# Patient Record
Sex: Male | Born: 1955 | Race: White | Hispanic: No | Marital: Married | Smoking: Never smoker
Health system: Southern US, Community
[De-identification: ages and names within clinical notes are randomized; demographics above are authoritative.]

## PROBLEM LIST (undated history)

## (undated) DIAGNOSIS — N289 Disorder of kidney and ureter, unspecified: Secondary | ICD-10-CM

## (undated) DIAGNOSIS — E7800 Pure hypercholesterolemia, unspecified: Secondary | ICD-10-CM

## (undated) DIAGNOSIS — I10 Essential (primary) hypertension: Secondary | ICD-10-CM

## (undated) DIAGNOSIS — E119 Type 2 diabetes mellitus without complications: Secondary | ICD-10-CM

## (undated) HISTORY — DX: Pure hypercholesterolemia, unspecified: E78.00

## (undated) HISTORY — DX: Disorder of kidney and ureter, unspecified: N28.9

## (undated) HISTORY — DX: Essential (primary) hypertension: I10

## (undated) HISTORY — DX: Type 2 diabetes mellitus without complications: E11.9

---

## 2012-04-28 ENCOUNTER — Ambulatory Visit: Payer: Self-pay | Admitting: Internal Medicine

## 2012-05-06 ENCOUNTER — Ambulatory Visit: Payer: Self-pay | Admitting: Internal Medicine

## 2012-06-12 ENCOUNTER — Ambulatory Visit: Payer: Self-pay | Admitting: Internal Medicine

## 2012-07-04 ENCOUNTER — Ambulatory Visit: Payer: Self-pay | Admitting: Internal Medicine

## 2017-12-28 ENCOUNTER — Other Ambulatory Visit: Payer: Self-pay | Admitting: Nephrology

## 2017-12-28 DIAGNOSIS — N182 Chronic kidney disease, stage 2 (mild): Secondary | ICD-10-CM

## 2018-01-02 ENCOUNTER — Ambulatory Visit: Payer: Self-pay

## 2018-01-04 ENCOUNTER — Encounter (INDEPENDENT_AMBULATORY_CARE_PROVIDER_SITE_OTHER): Payer: Self-pay

## 2018-01-04 ENCOUNTER — Ambulatory Visit
Admission: RE | Admit: 2018-01-04 | Discharge: 2018-01-04 | Disposition: A | Discharge status: Home / Self Care | Payer: BLUE CROSS/BLUE SHIELD | Source: Ambulatory Visit | Attending: Nephrology | Admitting: Nephrology

## 2018-01-04 DIAGNOSIS — N182 Chronic kidney disease, stage 2 (mild): Secondary | ICD-10-CM | POA: Diagnosis not present

## 2018-05-04 ENCOUNTER — Ambulatory Visit: Payer: BLUE CROSS/BLUE SHIELD | Attending: Orthopedic Surgery | Admitting: Physical Therapy

## 2018-05-04 DIAGNOSIS — M25611 Stiffness of right shoulder, not elsewhere classified: Secondary | ICD-10-CM | POA: Diagnosis present

## 2018-05-04 DIAGNOSIS — M7541 Impingement syndrome of right shoulder: Secondary | ICD-10-CM

## 2018-05-04 DIAGNOSIS — G8929 Other chronic pain: Secondary | ICD-10-CM

## 2018-05-04 DIAGNOSIS — M25511 Pain in right shoulder: Secondary | ICD-10-CM | POA: Diagnosis present

## 2018-05-04 DIAGNOSIS — M6281 Muscle weakness (generalized): Secondary | ICD-10-CM | POA: Insufficient documentation

## 2018-05-04 NOTE — Patient Instructions (Signed)
Access Code: [email or web address]  URL: [email or web address]  Date: 05/04/2018  Prepared by: Dorene Grebe   Exercises  Seated Scapular Retraction - 10 reps - 3 sets - 1x daily - 7x weekly  Circular Shoulder Pendulum with Table Support - 10 reps - 3 sets - 1x daily - 7x weekly  Doorway Pec Stretch at 60 Degrees Abduction with Arm Straight - 3 reps - 2 sets - 20 hold - 1x daily - 7x weekly

## 2018-05-04 NOTE — Therapy (Signed)
[place] [facility] [address] [place], [place], [address] Phone: [phone]   Fax:  [phone]  Physical Therapy Evaluation  Patient Details  Name: Andrew Acevedo MRN: [id number] Date of Birth: 12/03/1955 Referring Provider (PT): Dr. Martha Clan   Encounter Date: 05/04/2018  PT End of Session - 05/04/18 0900    Visit Number  1    Number of Visits  9    Date for PT Re-Evaluation  06/01/18    PT Start Time  0817    PT Stop Time  0926    PT Time Calculation (min)  69 min    Activity Tolerance  Patient tolerated treatment well;Patient limited by pain    Behavior During Therapy  [facility] for tasks assessed/performed       History reviewed. No pertinent past medical history.  History reviewed. No pertinent surgical history.  There were no vitals filed for this visit.   Subjective Assessment - 05/04/18 0823    Subjective  Pt. reports R shld pain for about 3 months which he thinks it happened while lifting at work. Pt. reports pain above 90 degrees of flexion and abduction, as well as horizontal abduction.     Pertinent History  Pt. sleeps on side and sometimes his pain will wake him up (5-6/10 NPS). Pt denies previous hx of shoulder pain. 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 Pt. states he is on full duty at work. Pt. reports trouble sleeping at night and his pain is the same throughout the day. Pt. reports no pain with sitting/ resting but increases with activity. Pt. denies N/T and states that he has tried ice but it has not been helpful. Pt. reports he is type I diabetic, has high blood pressure and high cholesterol..    Limitations  Lifting;House hold activities    Diagnostic tests  see imaging    Patient Stated Goals  relieve pain and be able to get R arm functional at both jobs    Currently in Pain?  No/denies    Pain Score  0-No pain    Pain Location  Shoulder    Pain  Orientation  Right    Pain Descriptors / Indicators  Sharp    Pain Onset  More than a month ago    Pain Frequency  Constant    Aggravating Factors   lifting, any movement past 90 degrees    Pain Relieving Factors  pain medication from MD helps "take the edge off"        AROM (degrees): Flexion: R 139 with pain/ L 158 Extension: R 30/ L WNL Abduction: R 119 with pain/ L 171 IR: R 56/ L 82 ER: R 64/ L 74  MMT: Flexion: R deferred due to pain and limited ROM L 4+/5 Serratus: R deferred due to pain and limited ROM L 4+/5 Abduction: R pain 4+/5 L 5/5 Biceps: R 4/5 with pain  L 5/5 Triceps: R 4/5 with pain L 5/5 ER: R deferred due to pain  L 4+/5 IR: R deferred due to pain L 5/5 Rhomboids: R deferred due to pain L 5/5 Lower trap: R deferred due to pain L 4+/5 Posterior deltoid: R deferred due to pain L 4+/5  Special Tests: Hawkins-Kennedy: R positive L negative Empty Can: R positive L negative Neers: R positive L negative  Palpation: No TTP on R shld  PAM: R Shld: No change in sx  with distraction, inferior mobs Scapula: demonstrated good movement of the shoulder.      PT Education - 05/04/18 [phone]    Education Details  HEP and importance of using ice to calm inflammation down    Person(s) Educated  Patient    Methods  Explanation;Demonstration    Comprehension  Verbalized understanding;Returned demonstration;Verbal cues required         Plan - 05/04/18 0853    Clinical Impression Statement  Pt. Is pleasant 63 y.o. male presenting to clinic with right shoulder pain for past 3 months.  PT noted normal mobility of shoulder joint/ capsular motion and no increase in pain with shoulder distraction. PT also did not find any abnormal pain or tenderness to palpation along any of the rotator cuff tendons. Pt. Demonstrated slight forward shoulder posture during sitting. Pt. Present with classic shoulder inpingment and in need of rotator cuff strengthening. Pt. Was educated to  start using ice on shoulder to decrease pain and swelling in order to regain pain free motion and to progress with rotator cuff strength and stability. Pt. Will benefit form skilled PT to increase shoulder strength for completing task at work and decrease pain.     Clinical Presentation  Stable    Clinical Decision Making  Low    Rehab Potential  Good    PT Frequency  2x / week    PT Duration  4 weeks    PT Treatment/Interventions  Patient/family education;Therapeutic activities;Cryotherapy;ADLs/Self Care Home Management;Therapeutic exercise;Neuromuscular re-education;Manual techniques;Passive range of motion    PT Next Visit Plan  Reassess ROM/ progress HEP    PT Home Exercise Plan  see handouts    Consulted and Agree with Plan of Care  Patient       Patient will benefit from skilled therapeutic intervention in order to improve the following deficits and impairments:  Impaired UE functional use, Decreased strength, Decreased activity tolerance, Decreased range of motion, Pain, Postural dysfunction, Decreased mobility  Visit Diagnosis: Impingement syndrome of right shoulder  Muscle weakness (generalized)  Decreased right shoulder range of motion  Chronic right shoulder pain     Problem List There are no active problems to display for this patient.  Cammie Mcgee, PT, DPT # [id number] Tomasa Hose, SPT 05/05/2018, 2:18 PM  [place] [facility] [address] [place], [place], [address] Phone: [phone]   Fax:  [phone]  Name: Andrew Acevedo MRN: [id number] Date of Birth: [birth year]

## 2018-05-05 ENCOUNTER — Encounter: Payer: Self-pay | Admitting: Physical Therapy

## 2018-05-11 ENCOUNTER — Encounter: Payer: BLUE CROSS/BLUE SHIELD | Admitting: Physical Therapy

## 2018-05-12 ENCOUNTER — Encounter: Payer: Self-pay | Admitting: Physical Therapy

## 2018-05-12 ENCOUNTER — Ambulatory Visit: Payer: BLUE CROSS/BLUE SHIELD | Attending: Orthopedic Surgery | Admitting: Physical Therapy

## 2018-05-12 DIAGNOSIS — M25511 Pain in right shoulder: Secondary | ICD-10-CM | POA: Insufficient documentation

## 2018-05-12 DIAGNOSIS — G8929 Other chronic pain: Secondary | ICD-10-CM | POA: Insufficient documentation

## 2018-05-12 DIAGNOSIS — M25611 Stiffness of right shoulder, not elsewhere classified: Secondary | ICD-10-CM | POA: Insufficient documentation

## 2018-05-12 DIAGNOSIS — M6281 Muscle weakness (generalized): Secondary | ICD-10-CM | POA: Diagnosis present

## 2018-05-12 DIAGNOSIS — M7541 Impingement syndrome of right shoulder: Secondary | ICD-10-CM | POA: Diagnosis not present

## 2018-05-12 NOTE — Therapy (Signed)
[place] [facility] [address] [place], [place], [address] Phone: [phone]   Fax:  [phone]  Physical Therapy Treatment  Patient Details  Name: Andrew Acevedo MRN: [id number] Date of Birth: 1956-03-17 Referring Provider (PT): Dr. Martha ClanKrasinski   Encounter Date: 05/12/2018  PT End of Session - 05/12/18 1021    Visit Number  2    Number of Visits  9    Date for PT Re-Evaluation  06/01/18    PT Start Time  0731    PT Stop Time  0820    PT Time Calculation (min)  49 min    Activity Tolerance  Patient tolerated treatment well;Patient limited by pain    Behavior During Therapy  [facility] for tasks assessed/performed       History reviewed. No pertinent past medical history.  History reviewed. No pertinent surgical history.  There were no vitals filed for this visit.  Subjective Assessment - 05/12/18 1025    Subjective  Pt. Reports improvement in shoulder a couple days after treatment. Pt. Reports not following HEP regularly but noticed it has been helping after completing. Today only 3/10 on NPS with overhead motion and no pain when resting arm.      Pertinent History  Pt. sleeps on side and sometimes his pain will wake him up (5-6/10 NPS). Pt denies previous hx of shoulder pain. 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 Pt. states he is on full duty at work. Pt. reports trouble sleeping at night and his pain is the same throughout the day. Pt. reports no pain with sitting/ resting but increases with activity. Pt. denies N/T and states that he has tried ice but it has not been helpful. Pt. reports he is type I diabetic, has high blood pressure and high cholesterol..    Limitations  Lifting;House hold activities    Diagnostic tests  see imaging    Patient Stated Goals  relieve pain and be able to get R arm functional at both jobs    Currently in Pain?  No/denies    Pain Onset  More than  a month ago          Manual Tx:  PROM ofRshoulder in all planes of motion in supineposition.(provided inferior mobilization with overhead motions)  R shoulderLADin supine with oscillationat 45 and 90 degrees shoulder abduction (no increase in pain) L shoulder PA/AP mobilization in supine position with arm at 45 degrees abduction.(no increase in pain) Right scapular mobilizations in all planes of motion 3x15 sec Right upper trap stretch 2x30 sec  Therex:   Resisted scap retractions 2x15 RTB. Supine AAROM shoulder protraction (pt. Demonstrated weakness with eccentric phase of exercise) AAROM shoulder IR and ER with right shoulder. Bilat supine pec stretch on half foam roller 3x30 sec           PT Long Term Goals - 05/04/18 0943      PT LONG TERM GOAL #1   Title  Pt. will increase FOTO score to >64 to improve pain-free functional mobility and ADLs.    Baseline  Eval FOTO: 41    Time  4    Period  Weeks    Status  New    Target Date  06/01/18      PT LONG TERM GOAL #2   Title  Pt. will decrease pain in right shoulder to 0/10 on NPS with lifting 10+ lbs items at  work.     Baseline  Pt. is currently only using left hand for lifting at work due to pain in right shoulder.     Time  4    Period  Weeks    Status  New    Target Date  06/01/18      PT LONG TERM GOAL #3   Title  Pt. will increase gross UE strength on right shoulder to 4+/5 with MMT to complete all functions at work.    Baseline  Pt. was unable to complete strength test on right arm due to pain.     Time  4    Period  Weeks    Status  New    Target Date  06/01/18      PT LONG TERM GOAL #4   Title  Pt. will be able to complete HEP independently and with 0/10 pain to improve functional mobility.    Baseline  Pt. just started with HEP of scap retractions and pendulums with minmal pain in right shoulder.     Time  4    Period  Weeks    Status  New    Target Date  06/01/18            Plan -  05/12/18 1201    Clinical Impression Statement  Pt. Entered clinic with decrease shoulder pain today. PT noted significant improvement in AROM bilaterally and WNL as compared to the left. PT also noted mild scapular winging on right side with arms by pt. Side. PT was able to achieve pain free shoulder flexion and abduction inferior moves and slight distractions during manual therapy. PT had pt. Perform pec stretches and overhead motion on half foam roller to improve thoracic extension and open up forward shoulder posture to improve pain free overhead motion. Pt. Demonstrated weakness with serratus anterior strengthening with supine serratus punches but no increase in pain. Pt will continue to benefit form skilled PT intervention to improve shoulder strength and continue to improve shoulder range of motion and get back to work without pain.     Clinical Presentation  Stable    Clinical Decision Making  Low    Rehab Potential  Good    PT Frequency  2x / week    PT Duration  4 weeks    PT Treatment/Interventions  Patient/family education;Therapeutic activities;Cryotherapy;ADLs/Self Care Home Management;Therapeutic exercise;Neuromuscular re-education;Manual techniques;Passive range of motion    PT Next Visit Plan  Reassess ROM/ progress HEP    PT Home Exercise Plan  see handouts    Consulted and Agree with Plan of Care  Patient       Patient will benefit from skilled therapeutic intervention in order to improve the following deficits and impairments:  Impaired UE functional use, Decreased strength, Decreased activity tolerance, Decreased range of motion, Pain, Postural dysfunction, Decreased mobility  Visit Diagnosis: Impingement syndrome of right shoulder  Muscle weakness (generalized)  Decreased right shoulder range of motion  Chronic right shoulder pain     Problem List There are no active problems to display for this patient.  Cammie McgeeMichael C Sherk, PT, DPT # [phone] Rosalyn ChartersJosh Ayyan Sites, SPT 05/13/2018,  8:31 AM  [place] [facility] [address] [place], [place], [address] Phone: [phone]   Fax:  [phone]  Name: Andrew Acevedo MRN: [id number] Date of Birth: October 24, 1955

## 2018-05-18 ENCOUNTER — Ambulatory Visit: Payer: BLUE CROSS/BLUE SHIELD | Admitting: Physical Therapy

## 2018-05-18 ENCOUNTER — Encounter: Payer: Self-pay | Admitting: Physical Therapy

## 2018-05-18 DIAGNOSIS — M7541 Impingement syndrome of right shoulder: Secondary | ICD-10-CM

## 2018-05-18 DIAGNOSIS — G8929 Other chronic pain: Secondary | ICD-10-CM

## 2018-05-18 DIAGNOSIS — M25611 Stiffness of right shoulder, not elsewhere classified: Secondary | ICD-10-CM

## 2018-05-18 DIAGNOSIS — M25511 Pain in right shoulder: Secondary | ICD-10-CM

## 2018-05-18 DIAGNOSIS — M6281 Muscle weakness (generalized): Secondary | ICD-10-CM

## 2018-05-18 NOTE — Therapy (Addendum)
[place] [facility] [address] [place], [place], [address] Phone: [phone]   Fax:  [phone]  Physical Therapy Treatment  Patient Details  Name: Andrew CongoDaniel Acevedo MRN: [id number] Date of Birth: November 04, 1955 Referring Provider (PT): Dr. Martha ClanKrasinski   Encounter Date: 05/18/2018  PT End of Session - 05/18/18 0837    Visit Number  3    Number of Visits  9    Date for PT Re-Evaluation  06/01/18    PT Start Time  0729    PT Stop Time  0815    PT Time Calculation (min)  46 min    Activity Tolerance  Patient tolerated treatment well;Patient limited by pain    Behavior During Therapy  [facility] for tasks assessed/performed       History reviewed. No pertinent past medical history.  History reviewed. No pertinent surgical history.  There were no vitals filed for this visit.   Subjective Assessment - 05/18/18 0837    Subjective  Pt. Entered clinic with 0/10 NPS at rest. Pt. Noted he was unable to perform HEP due to traveling for work. Pt. Reported some discomfort and pinching with overhead motion at end range with flexion and abduction.     Pertinent History  Pt. sleeps on side and sometimes his pain will wake him up (5-6/10 NPS). Pt denies previous hx of shoulder pain. 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 Pt. states he is on full duty at work. Pt. reports trouble sleeping at night and his pain is the same throughout the day. Pt. reports no pain with sitting/ resting but increases with activity. Pt. denies N/T and states that he has tried ice but it has not been helpful. Pt. reports he is type I diabetic, has high blood pressure and high cholesterol..    Limitations  Lifting;House hold activities    Diagnostic tests  see imaging    Patient Stated Goals  relieve pain and be able to get R arm functional at both jobs    Currently in Pain?  No/denies        Manual Tx:  PROM  ofRshoulder in all planes of motion in supineposition.(provided inferior mobilization with overhead motions) (pt. Reported pinching end range abduction) R shoulderLADin supine with oscillationat 45 and 90 degrees shoulder abduction (no increase in pain) R shoulder PA/AP mobilization in supine position with arm at 45 degrees abduction.(no increase in pain)     Therex:  Resisted scap retractions 3x15 with 50lb wt. Supine shoulder protraction with 3 lb wt. (pt. Demonstrated weakness with eccentric phase of exercise) Resisted ER with YTB 2x15 Resisted IR with YTB 2x15 Standing shoulder flexion with wall assisted with extension at the end 3x15 Bilat resisted shoulder extension with BTB 1x15 Bilat supine pec stretch on half foam roller 3x30 sec     Cryotherapy: (not charged)  Ice to right shoulder in sitting 12 min       PT Long Term Goals - 05/04/18 0943      PT LONG TERM GOAL #1   Title  Pt. will increase FOTO score to >64 to improve pain-free functional mobility and ADLs.    Baseline  Eval FOTO: 41    Time  4    Period  Weeks    Status  New    Target Date  06/01/18      PT LONG TERM GOAL #2   Title  Pt. will  decrease pain in right shoulder to 0/10 on NPS with lifting 10+ lbs items at work.     Baseline  Pt. is currently only using left hand for lifting at work due to pain in right shoulder.     Time  4    Period  Weeks    Status  New    Target Date  06/01/18      PT LONG TERM GOAL #3   Title  Pt. will increase gross UE strength on right shoulder to 4+/5 with MMT to complete all functions at work.    Baseline  Pt. was unable to complete strength test on right arm due to pain.     Time  4    Period  Weeks    Status  New    Target Date  06/01/18      PT LONG TERM GOAL #4   Title  Pt. will be able to complete HEP independently and with 0/10 pain to improve functional mobility.    Baseline  Pt. just started with HEP of scap retractions and pendulums with minmal pain  in right shoulder.     Time  4    Period  Weeks    Status  New    Target Date  06/01/18            Plan - 05/18/18 0843    Clinical Impression Statement  Pt. Entered clinic with some increase in discomfort with overhead motion at end range flexion and abduction compared to last visit. PT noted hypomobility with inferior mobilization with right shoulder. Pt. Still experienced some pain with overhead motion in standing following manual therapy. PT directed therex to work around painful positions to not continue irritation of right shoulder. Pt. Was instructed to return to following HEP this week as prescribed. Pt. Will continue to benefit form skilled PT to improve pain free shoulder motion and return to full load at work.     Clinical Presentation  Stable    Clinical Decision Making  Low    Rehab Potential  Good    PT Frequency  2x / week    PT Duration  4 weeks    PT Treatment/Interventions  Patient/family education;Therapeutic activities;Cryotherapy;ADLs/Self Care Home Management;Therapeutic exercise;Neuromuscular re-education;Manual techniques;Passive range of motion    PT Next Visit Plan  Reassess ROM/ progress HEP    PT Home Exercise Plan  see handouts    Consulted and Agree with Plan of Care  Patient       Patient will benefit from skilled therapeutic intervention in order to improve the following deficits and impairments:  Impaired UE functional use, Decreased strength, Decreased activity tolerance, Decreased range of motion, Pain, Postural dysfunction, Decreased mobility  Visit Diagnosis: Impingement syndrome of right shoulder  Muscle weakness (generalized)  Decreased right shoulder range of motion  Chronic right shoulder pain     Problem List There are no active problems to display for this patient.  Cammie Mcgee, PT, DPT # [id number] Rosalyn Charters, SPT 05/18/2018, 2:20 PM   [facility] [address] [place], [place],  [address] Phone: [phone]   Fax:  [phone]  Name: Andrew Acevedo MRN: [id number] Date of Birth: November 27, 1955

## 2018-05-23 ENCOUNTER — Ambulatory Visit: Payer: BLUE CROSS/BLUE SHIELD | Admitting: Physical Therapy

## 2018-05-23 ENCOUNTER — Encounter: Payer: Self-pay | Admitting: Physical Therapy

## 2018-05-23 DIAGNOSIS — M25611 Stiffness of right shoulder, not elsewhere classified: Secondary | ICD-10-CM

## 2018-05-23 DIAGNOSIS — M7541 Impingement syndrome of right shoulder: Secondary | ICD-10-CM | POA: Diagnosis not present

## 2018-05-23 DIAGNOSIS — G8929 Other chronic pain: Secondary | ICD-10-CM

## 2018-05-23 DIAGNOSIS — M25511 Pain in right shoulder: Secondary | ICD-10-CM

## 2018-05-23 DIAGNOSIS — M6281 Muscle weakness (generalized): Secondary | ICD-10-CM

## 2018-05-23 NOTE — Therapy (Signed)
[place] [facility] [address] [place], [place], [address] Phone: [phone]   Fax:  [phone]  Physical Therapy Treatment  Patient Details  Name: Andrew Acevedo MRN: [id number] Date of Birth: Dec 07, 1955 Referring Provider (PT): Dr. Martha Clan   Encounter Date: 05/23/2018  PT End of Session - 05/23/18 0824    Visit Number  4    Number of Visits  9    Date for PT Re-Evaluation  06/01/18    PT Start Time  0729    PT Stop Time  0801    PT Time Calculation (min)  32 min    Activity Tolerance  Patient tolerated treatment well;Patient limited by pain    Behavior During Therapy  [facility] for tasks assessed/performed       History reviewed. No pertinent past medical history.  History reviewed. No pertinent surgical history.  There were no vitals filed for this visit.      Manual Tx:  PROM ofRshoulder in flexion and abduction (provided inferior  mobilization with overhead motion)  RshoulderLADin supine with oscillationat 45 and 90 degrees shoulder abduction (no increase in pain) R shoulder PA/AP mobilization in supine position with arm at 45 degrees abduction.(no increase in pain) 1x15sec   Therex:   Wall serratus punches 2x15 YTB resisted ER with arms going up wall. 2x15 Resisted ER/W's with RTB 2x15  Single arm serratus punches 1x15 (mild winging of right scapula during end range protraction)  Resisted adduction, painful with resistance 1x15 (PT verbal cueing to keep shoulder in neutral position free of elevation and winging of the scapula during motion)  Resisted ER 2x15 (pt. Demonstrated hypomobility with ER and experienced pain with resisted motion. PT mod verbal cueing to keep elbow adducted and not abduct the humerus)        PT Long Term Goals - 05/04/18 0943      PT LONG TERM GOAL #1   Title  Pt. will increase FOTO score to >64 to improve pain-free functional mobility and ADLs.    Baseline  Eval FOTO: 41     Time  4    Period  Weeks    Status  New    Target Date  06/01/18      PT LONG TERM GOAL #2   Title  Pt. will decrease pain in right shoulder to 0/10 on NPS with lifting 10+ lbs items at work.     Baseline  Pt. is currently only using left hand for lifting at work due to pain in right shoulder.     Time  4    Period  Weeks    Status  New    Target Date  06/01/18      PT LONG TERM GOAL #3   Title  Pt. will increase gross UE strength on right shoulder to 4+/5 with MMT to complete all functions at work.    Baseline  Pt. was unable to complete strength test on right arm due to pain.     Time  4    Period  Weeks    Status  New    Target Date  06/01/18      PT LONG TERM GOAL #4   Title  Pt. will be able to complete HEP independently and with 0/10 pain to improve functional mobility.    Baseline  Pt. just started with HEP of scap retractions and pendulums with minmal pain in right shoulder.     Time  4  Period  Weeks    Status  New    Target Date  06/01/18         Plan - 05/23/18 0825    Clinical Impression Statement  PT noted decreased motion with scapula during end range right shoulder flexion. PT also noted the right scapula demonstrated limited protraction during end range single arm serratus punches. Pt. tolerated treatment well but was limited with pain during resisted adduction and ER. Pt. Will continue to benefit from skilled PT intervention to improve ROM and decrease pain to return to full load at work.     Clinical Presentation  Stable    Clinical Decision Making  Low    Rehab Potential  Good    PT Frequency  2x / week    PT Duration  4 weeks    PT Treatment/Interventions  Patient/family education;Therapeutic activities;Cryotherapy;ADLs/Self Care Home Management;Therapeutic exercise;Neuromuscular re-education;Manual techniques;Passive range of motion    PT Next Visit Plan  Reassess ROM/ progress HEP    PT Home Exercise Plan  see handouts       Patient will benefit  from skilled therapeutic intervention in order to improve the following deficits and impairments:  Impaired UE functional use, Decreased strength, Decreased activity tolerance, Decreased range of motion, Pain, Postural dysfunction, Decreased mobility  Visit Diagnosis: Impingement syndrome of right shoulder  Muscle weakness (generalized)  Decreased right shoulder range of motion  Chronic right shoulder pain     Problem List There are no active problems to display for this patient.  Cammie Mcgee, PT, DPT # [phone] Rosalyn Charters, SPT 05/23/2018, 9:53 AM  [place] [facility] [address] [place], [place], [address] Phone: [phone]   Fax:  [phone]  Name: Andrew Acevedo MRN: [id number] Date of Birth: December 29, 1955

## 2018-05-25 ENCOUNTER — Ambulatory Visit: Payer: BLUE CROSS/BLUE SHIELD | Admitting: Physical Therapy

## 2018-05-26 ENCOUNTER — Ambulatory Visit: Payer: BLUE CROSS/BLUE SHIELD | Admitting: Physical Therapy

## 2018-05-30 ENCOUNTER — Ambulatory Visit: Payer: BLUE CROSS/BLUE SHIELD | Admitting: Physical Therapy

## 2018-06-01 ENCOUNTER — Encounter: Payer: Self-pay | Admitting: Physical Therapy

## 2018-06-01 ENCOUNTER — Ambulatory Visit: Payer: BLUE CROSS/BLUE SHIELD | Admitting: Physical Therapy

## 2018-06-01 DIAGNOSIS — M25511 Pain in right shoulder: Secondary | ICD-10-CM

## 2018-06-01 DIAGNOSIS — G8929 Other chronic pain: Secondary | ICD-10-CM

## 2018-06-01 DIAGNOSIS — M7541 Impingement syndrome of right shoulder: Secondary | ICD-10-CM

## 2018-06-01 DIAGNOSIS — M6281 Muscle weakness (generalized): Secondary | ICD-10-CM

## 2018-06-01 DIAGNOSIS — M25611 Stiffness of right shoulder, not elsewhere classified: Secondary | ICD-10-CM

## 2018-06-01 NOTE — Therapy (Signed)
[facility] [address] [place], [place], [address] Phone: [phone]   Fax:  [phone]  Physical Therapy Treatment/Discharge  Patient Details  Name: Andrew Acevedo MRN: [id number] Date of Birth: [birth year] Referring Provider (PT): Dr. Martha Clan   Encounter Date: 06/01/2018  PT End of Session - 06/01/18 1038    Visit Number  5    Number of Visits  9    Date for PT Re-Evaluation  06/01/18    PT Start Time  0922    PT Stop Time  1012    PT Time Calculation (min)  50 min    Activity Tolerance  Patient tolerated treatment well;Patient limited by pain    Behavior During Therapy  [facility] for tasks assessed/performed       History reviewed. No pertinent past medical history.  History reviewed. No pertinent surgical history.  There were no vitals filed for this visit.  Subjective Assessment - 06/01/18 1037    Subjective  Pt. Entered today with no complaints of pain or discomfort. Pt. Reported he is feeling good with therapy and thinks he is ready to return to regular work load.     Pertinent History  Pt. sleeps on side and sometimes his pain will wake him up (5-6/10 NPS). Pt denies previous hx of shoulder pain. 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 Pt. states he is on full duty at work. Pt. reports trouble sleeping at night and his pain is the same throughout the day. Pt. reports no pain with sitting/ resting but increases with activity. Pt. denies N/T and states that he has tried ice but it has not been helpful. Pt. reports he is type I diabetic, has high blood pressure and high cholesterol..    Limitations  Lifting;House hold activities    Diagnostic tests  see imaging    Patient Stated Goals  relieve pain and be able to get R arm functional at both jobs    Currently in Pain?  No/denies          Therex:   Supine Pec minor punches on half foam roller 2x10 Supine  shoulder abduction on half foam roller 2x10  (Pt. Demonstrated good movement without pain) Supine shoulder rhythmic stabilization 2x20 sec (PT provided rhythmic stabilizations for shoulder stability resistance)  Bilat supine serratus punches 8 lbs 2x15 (PT. provided Standing body blade at 80 degrees flexion and 45 degrees abduction 6x15 sec (PT provided mod verbal feedback for pt. To keep shoulders depressed and locked to improve shoulder stability/posture)  One 15 lbs box lift from floor to high shelf for assessment (Pt completed with no complaints of pain)  Nautilus right shoulder ER with 10 lbs 1x15 Right shoulder resisted ER with BTB (PT provided mod verbal cueing for pt. To keep shoulder/elbow adduction to achieve true ER)  Push up plus serratus strengthening in quadruped position 2x15 (PT provided mod tactile cueing for pt. To achieve correct motion)     AROM (degrees): Flexion: R 155/ L 162 Abduction: R  172/ L 171 IR: R 85/ L 86 ER: R 65/ L 67 (no pain with any motion)    MMT: R/L Flexion: 5/5 Serratus: 4/5 Abduction: 5/5 Biceps: 5/5 Triceps: 5/5 ER: 4 little bit of pain/5  IR: 5/5   Palpation: No TTP on R shld       PT Long Term Goals - 06/01/18 1105  PT LONG TERM GOAL #1   Title  Pt. will increase FOTO score to >64 to improve pain-free functional mobility and ADLs.    Baseline  Eval FOTO: 41; 80 on 06/01/18    Time  4    Period  Weeks    Status  Achieved    Target Date  06/01/18      PT LONG TERM GOAL #2   Title  Pt. will decrease pain in right shoulder to 0/10 on NPS with lifting 10+ lbs items at work.     Baseline  Pt. is currently only using left hand for lifting at work due to pain in right shoulder.     Time  4    Period  Weeks    Status  Achieved    Target Date  06/01/18      PT LONG TERM GOAL #3   Title  Pt. will increase gross UE strength on right shoulder to 4+/5 with MMT to complete all functions at work.    Baseline  Pt. was unable to  complete strength test on right arm due to pain.     Time  4    Period  Weeks    Status  Achieved    Target Date  06/01/18      PT LONG TERM GOAL #4   Title  Pt. will be able to complete HEP independently and with 0/10 pain to improve functional mobility.    Baseline  Pt. just started with HEP of scap retractions and pendulums with minmal pain in right shoulder.     Time  4    Period  Weeks    Status  Achieved    Target Date  06/01/18            Plan - 06/01/18 1039    Clinical Impression Statement  Pt. Entered clinic today with excellent shoulder motion and no compliants of pain. Pt. Demonstrated excellent bilat UE strength with MMT scoring 5/5 with the exception of right ER and serratus anterior scoring 4/5. Pt. Had no pain with all AROM of shoulder in flexion/abd/ER/IR of right shoulder. Pt. Demonstrated no pain and excllent form with lifting 15 lb box to high over head shelf. Pt. Demonstrated some weakness with serratus resisted punches but was able to maintain quality form thoroughout session and was educated to continue strengthening with HEP. PT provided mod verbal cueing with body blade exercise for patient to keep shoulders packed and depressed to improve strength and posture awareness. PT educated pt. To continue with HEP to improve more right shoulder stability/edurance. PT also educated pt. To slowly work into full load at his job and use ice/slow down if he feels pain.     Clinical Presentation  Stable    Clinical Decision Making  Low    Rehab Potential  Good    PT Frequency  2x / week    PT Duration  4 weeks    PT Treatment/Interventions  Patient/family education;Therapeutic activities;Cryotherapy;ADLs/Self Care Home Management;Therapeutic exercise;Neuromuscular re-education;Manual techniques;Passive range of motion    PT Next Visit Plan  Reassess ROM/ progress HEP    PT Home Exercise Plan  see handouts    Consulted and Agree with Plan of Care  Patient       Patient  will benefit from skilled therapeutic intervention in order to improve the following deficits and impairments:  Impaired UE functional use, Decreased strength, Decreased activity tolerance, Decreased range of motion, Pain, Postural dysfunction, Decreased mobility  Visit  Diagnosis: Impingement syndrome of right shoulder  Muscle weakness (generalized)  Decreased right shoulder range of motion  Chronic right shoulder pain     Problem List There are no active problems to display for this patient.  Cammie Mcgee, PT, DPT # [id number] Rosalyn Charters, SPT 06/01/2018, 11:06 AM  [facility] [address] [place], [place], [address] Phone: [phone]   Fax:  [phone]  Name: Andrew Acevedo MRN: [id number] Date of Birth: [birth year]

## 2018-12-08 ENCOUNTER — Other Ambulatory Visit: Payer: Self-pay

## 2018-12-08 DIAGNOSIS — Z20822 Contact with and (suspected) exposure to covid-19: Secondary | ICD-10-CM

## 2018-12-09 LAB — NOVEL CORONAVIRUS, NAA: SARS-CoV-2, NAA: NOT DETECTED

## 2018-12-20 ENCOUNTER — Other Ambulatory Visit: Payer: Self-pay

## 2018-12-20 DIAGNOSIS — Z20822 Contact with and (suspected) exposure to covid-19: Secondary | ICD-10-CM

## 2018-12-21 LAB — NOVEL CORONAVIRUS, NAA: SARS-CoV-2, NAA: NOT DETECTED

## 2018-12-27 ENCOUNTER — Other Ambulatory Visit: Payer: Self-pay | Admitting: *Deleted

## 2018-12-27 DIAGNOSIS — Z20822 Contact with and (suspected) exposure to covid-19: Secondary | ICD-10-CM

## 2018-12-28 LAB — NOVEL CORONAVIRUS, NAA: SARS-CoV-2, NAA: NOT DETECTED

## 2019-03-09 ENCOUNTER — Other Ambulatory Visit: Payer: Self-pay

## 2019-03-09 DIAGNOSIS — Z20822 Contact with and (suspected) exposure to covid-19: Secondary | ICD-10-CM

## 2019-03-12 LAB — NOVEL CORONAVIRUS, NAA: SARS-CoV-2, NAA: NOT DETECTED

## 2019-03-21 ENCOUNTER — Ambulatory Visit: Payer: BC Managed Care – PPO | Attending: Internal Medicine

## 2019-03-21 ENCOUNTER — Other Ambulatory Visit: Payer: Self-pay

## 2019-03-21 DIAGNOSIS — Z20822 Contact with and (suspected) exposure to covid-19: Secondary | ICD-10-CM

## 2019-03-24 LAB — NOVEL CORONAVIRUS, NAA: SARS-CoV-2, NAA: NOT DETECTED

## 2020-01-06 IMAGING — US US RENAL
1 series · 14 of 25 positions shown · non-contrast
Comparison: None

CLINICAL DATA: Stage II chronic kidney disease

EXAM:
RENAL / URINARY TRACT ULTRASOUND COMPLETE

[Series 1: us renal · 0.23mm/px · 14 of 33 slices shown]
[im 1/33]
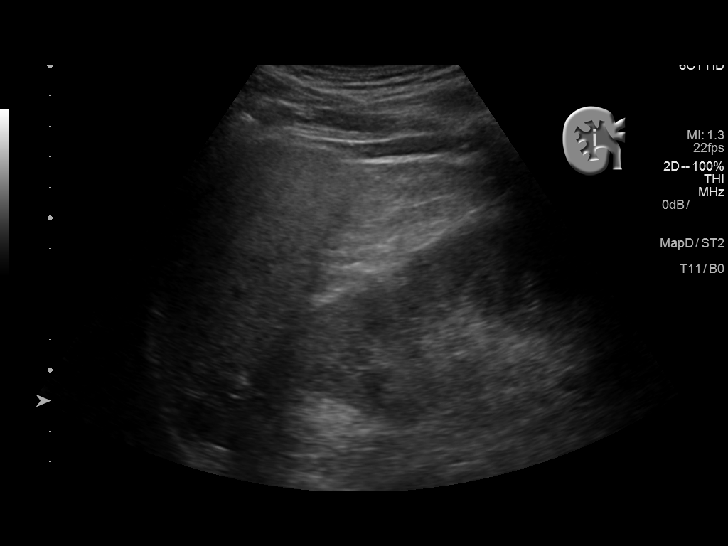
[im 3/33]
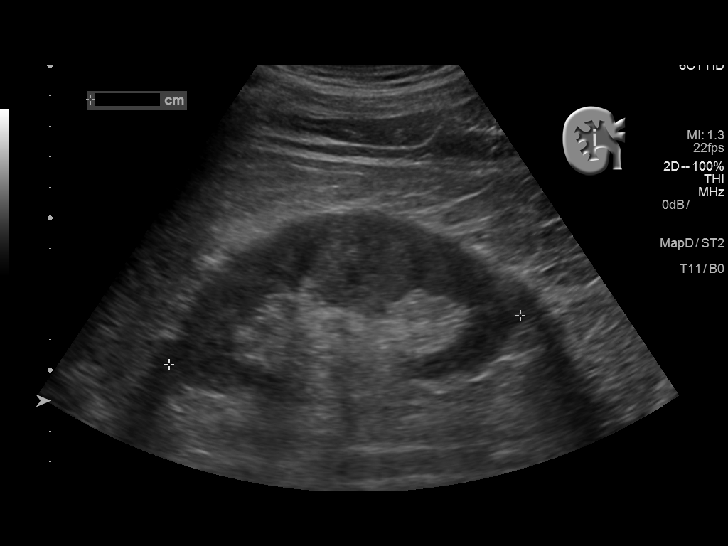
[im 6/33]
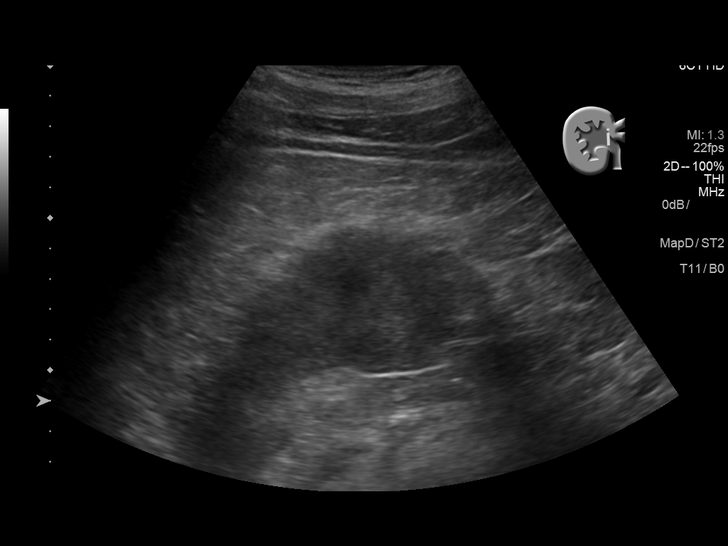
[im 9/33]
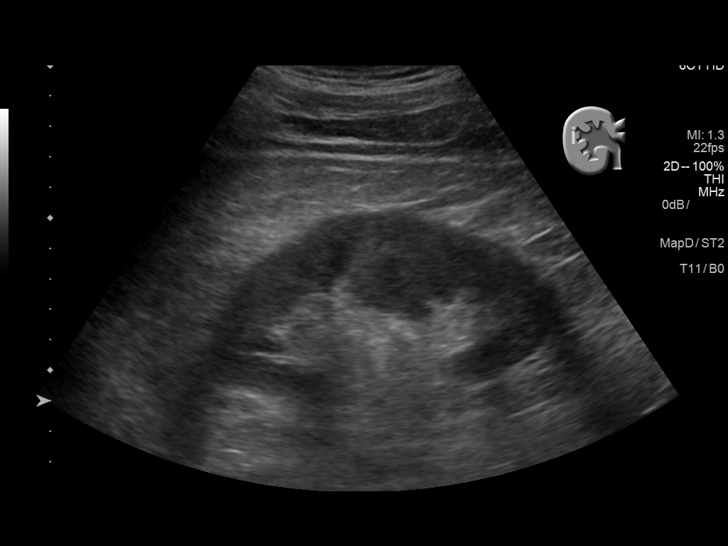
[im 11/33]
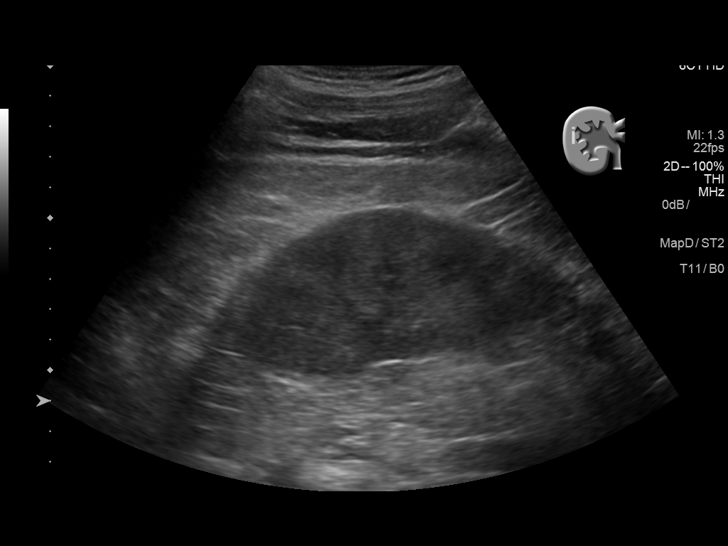
[im 13/33]
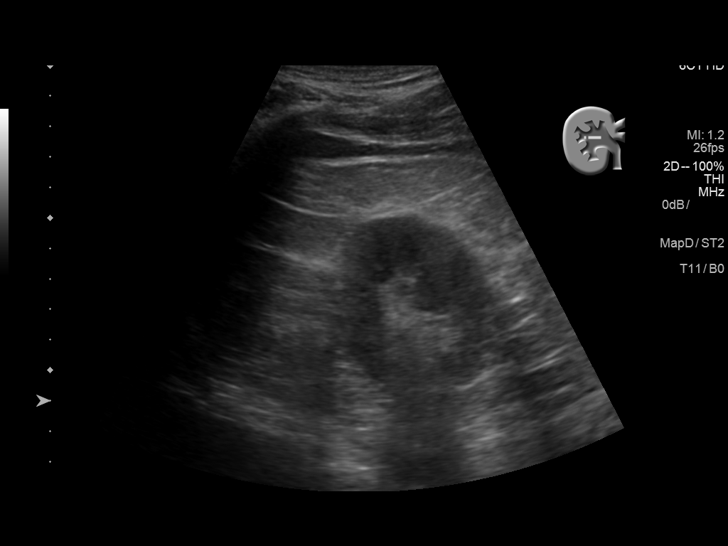
[im 15/33]
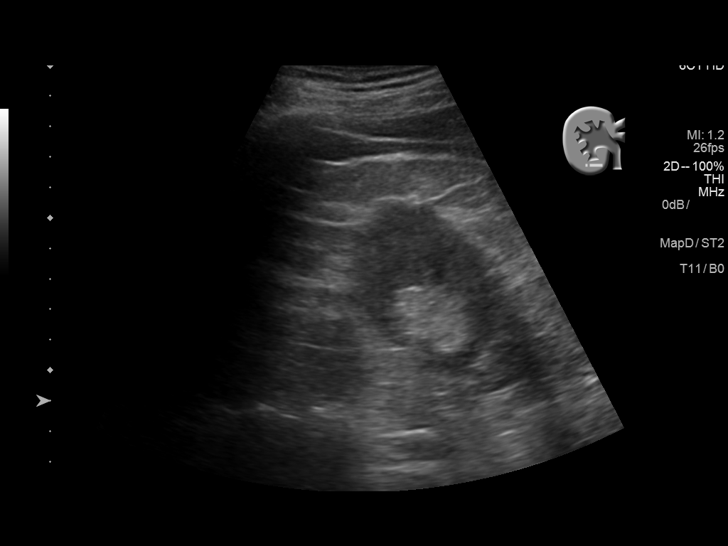
[im 18/33]
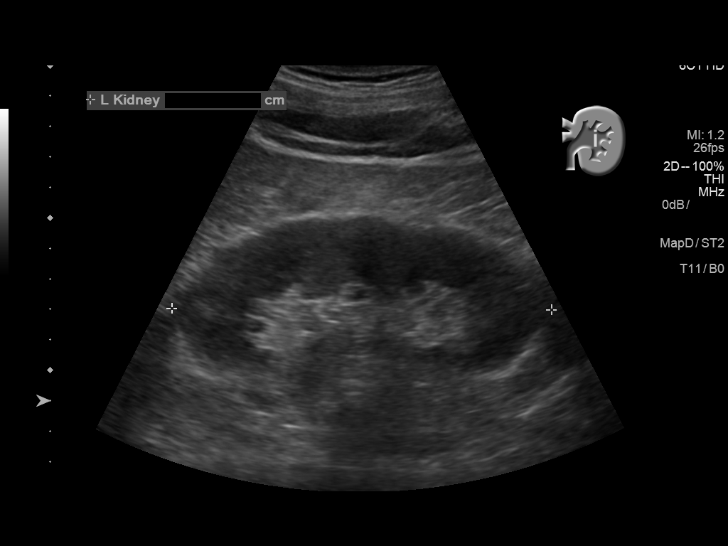
[im 21/33]
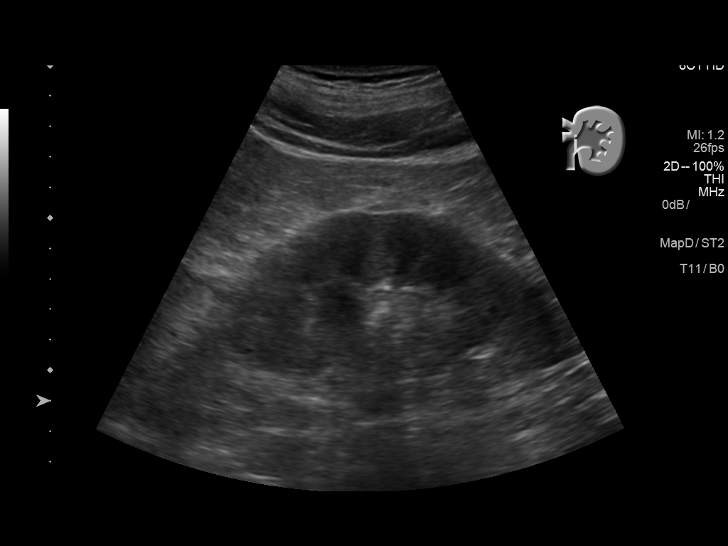
[im 22/33]
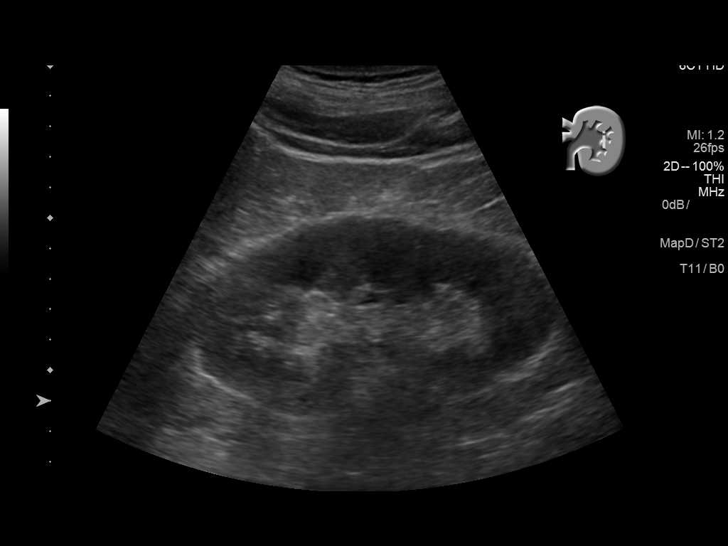
[im 25/33]
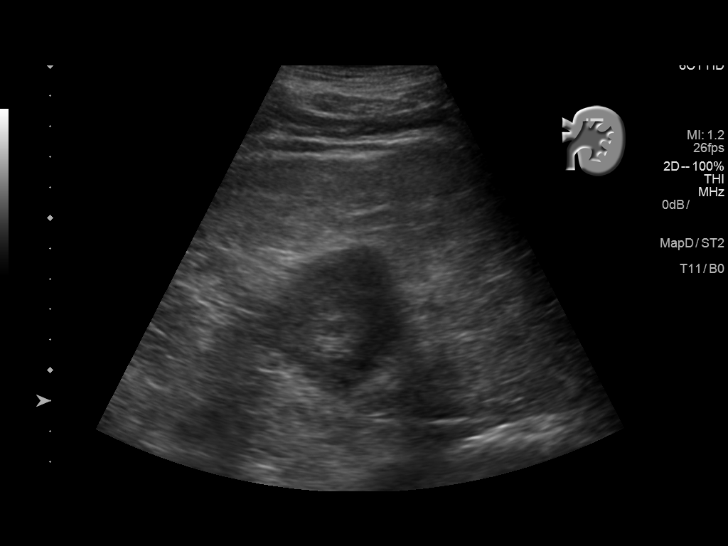
[im 27/33]
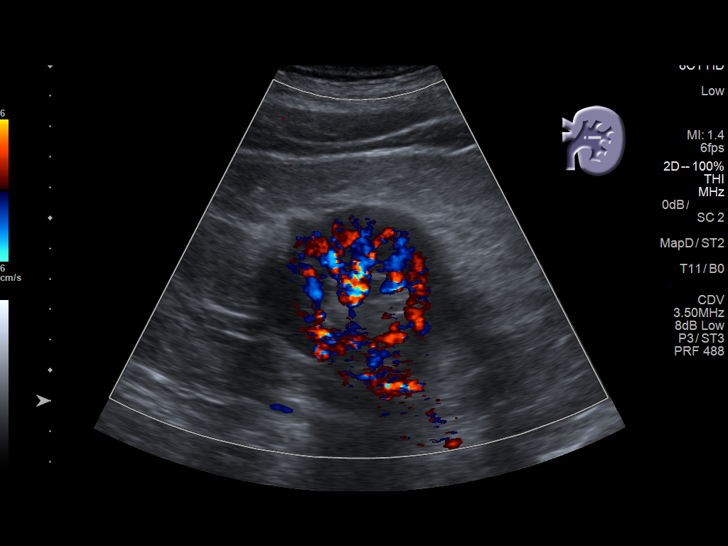
[im 30/33]
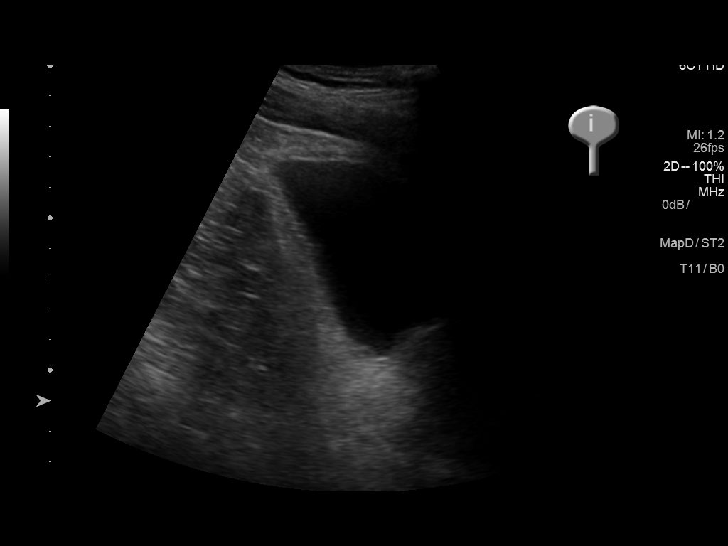
[im 33/33]
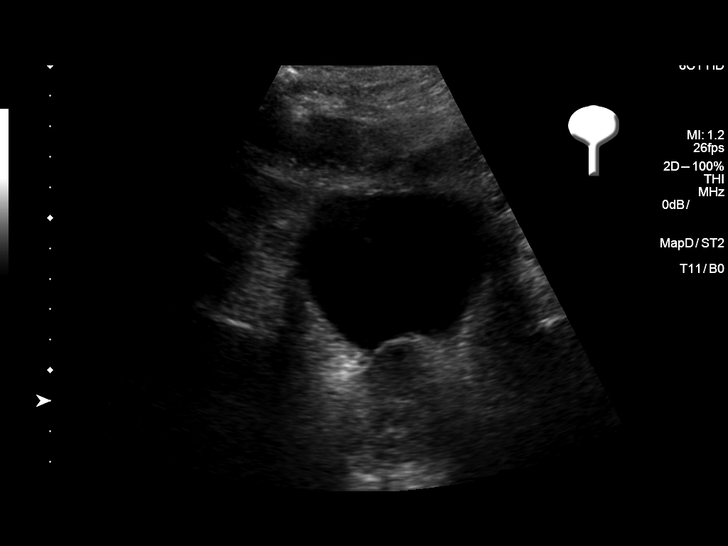

[14 of 25 positions shown; findings below may reference images not displayed]

FINDINGS: Right Kidney:

Length: 11.6 cm. Normal cortical thickness. Minimally increased
cortical echogenicity. No mass, hydronephrosis or shadowing
calcification.

Left Kidney:

Length: 12.5 cm. Normal cortical thickness. Minimally increased
cortical echogenicity. No mass, hydronephrosis or shadowing
calcification.

Bladder:

Appears normal for degree of bladder distention.
IMPRESSION: Probable medical renal disease changes of both kidneys.

No evidence of renal mass or hydronephrosis.

## 2020-12-16 ENCOUNTER — Ambulatory Visit: Payer: BC Managed Care – PPO | Attending: Orthopedic Surgery | Admitting: Physical Therapy

## 2020-12-16 ENCOUNTER — Other Ambulatory Visit: Payer: Self-pay

## 2020-12-16 ENCOUNTER — Encounter: Payer: Self-pay | Admitting: Physical Therapy

## 2020-12-16 DIAGNOSIS — M5440 Lumbago with sciatica, unspecified side: Secondary | ICD-10-CM | POA: Diagnosis present

## 2020-12-16 DIAGNOSIS — R262 Difficulty in walking, not elsewhere classified: Secondary | ICD-10-CM | POA: Diagnosis present

## 2020-12-16 DIAGNOSIS — M6281 Muscle weakness (generalized): Secondary | ICD-10-CM | POA: Insufficient documentation

## 2020-12-16 NOTE — Therapy (Signed)
[facility] [address] [place], [place], [address] Phone: [phone]   Fax:  [phone]  Physical Therapy Evaluation  Patient Details  Name: Andrew Acevedo MRN: [id number] Date of Birth: Oct 24, 1955 Referring Provider (PT): Altamese Cabal, PA-C  Encounter Date: 12/16/2020   PT End of Session - 12/16/20 1001     Visit Number 1    Number of Visits 13    Date for PT Re-Evaluation 01/29/21    Authorization Type 50 PT visits per calendar year    Authorization Time Period Cert 9/56/38-75/64/33    PT Start Time 0948    PT Stop Time 1036    PT Time Calculation (min) 48 min    Activity Tolerance Patient limited by pain    Behavior During Therapy [facility] for tasks assessed/performed             Past Medical History:  Diagnosis Date   Diabetes (HCC)    High cholesterol    Hypertension    Kidney disease     History reviewed. No pertinent surgical history.  There were no vitals filed for this visit.    Subjective Assessment - 12/16/20 1536     Subjective 65 year old male with complaint of R>LLE referred pain with low back and L gluteal pain; difficulty with standing/walking/weightbearing activity.    Pertinent History Pt is a 65 year old male with primary complaint of pain radiating down his R>LLE with numbness in his R foot along lateral plantar aspect. Patient reports pain in low back and posterior hip region (more pain in L hip). Patient reports symptoms started with heavy lifting (began about 2 weeks ago) - pain did not start immediately upon lifting. Insidious onset of pain following lifting. He initially went to [facility] and underwent prednisone taper - this did help, but he has ongoing pain and reports feeling more pain on Monday following completion of prednisone taper. He reports some back injuries in high school, but current "shooting pain" is new. (-) X-ray for fracture/dislocation or acute findings. Pt was informed about  some spondylosis and degenerative disc changes. 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 Patient reports disturbed sleep when turning over in bed - this can improve with re-positioning. Patient reports no bowel/bladder changes.    Limitations Walking;Standing;House hold activities;Lifting    Diagnostic tests Lumbar spine radiographs taken outside of [facility] setting; per patient report, evidence of spondylosis and degenerative disc changes    Patient Stated Goals Improved pain, improved walking    Aggravating Factors  turning over/twisting when liyng down, standing up, bilateral LE weightbearing, prolonged "stationary" position (sitting at computer, driving)    Pain Relieving Factors sitting, lying supine                [facility] PT Assessment - 12/16/20 1539       Assessment   Medical Diagnosis lumbar radiculopathy    Referring Provider (PT) Altamese Cabal, PA-C    Onset Date/Surgical Date 12/02/20    Next MD Visit In 2 weeks per pt report    Prior Therapy None for this condition      Balance Screen   Has the patient fallen in the past 6 months No      Prior Function   Level of Independence Independent      Cognition   Overall Cognitive Status Within Functional Limits for tasks assessed  SUBJECTIVE Chief complaint:  65 year old male with complaint of R>LLE referred pain with low back and L gluteal pain; difficulty with standing/walking/weightbearing activity.  History: Pt is a 65 year old male with primary complaint of pain radiating down his R>LLE with numbness in his R foot along lateral plantar aspect. Patient reports pain in low back and posterior hip region (more pain in L hip). Patient reports symptoms started with heavy lifting (began about 2 weeks ago) - pain did not start immediately upon lifting. Insidious onset of  pain following lifting. He initially went to [facility] and underwent prednisone taper - this did help, but he has ongoing pain and reports feeling more pain on Monday following completion of prednisone taper. He reports some back injuries in high school, but current "shooting pain" is new. (-) X-ray for fracture/dislocation or acute findings. Pt was informed about some spondylosis and degenerative disc changes. 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 Patient reports disturbed sleep when turning over in bed - this can improve with re-positioning. Patient reports no bowel/bladder changes.  Referring Dx: lumbar radiculopathy Referring Provider: Altamese Cabal, PA Pain location: low back, lumbosacral level, L hip; pain radiating down R lower limb  Pain: Present 0/10, Best 0/10, Worst 8-9/10: Pain quality: pain quality: shooting Radiating pain: Yes  Numbness/Tingling: Yes 24 hour pain behavior: None Aggravating factors: turning over/twisting when lying down, standing up, bilateral LE weightbearing, prolonged "stationary" position (sitting at computer, driving) Easing factors: sitting, lying supine How long can you sit: no limit  History of back injury, pain, surgery, or therapy: None since high school injury   Follow-up appointment with MD: Yes, in 2 weeks per patient report  Imaging: Yes, lumbar spine radiographs; per patient report, evidence of spondylosis and degenerative disc changes Falls in the last 6 months: No   Occupational demands: 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   Goals: Improved pain, improved walking  Red flags (bowel/bladder changes, saddle paresthesia, personal history of cancer, chills/fever, night sweats, unrelenting pain, first onset of  insidious LBP <20 y/o) Negative    OBJECTIVE  Mental Status Patient is oriented to person, place and time.  Recent memory is intact.  Remote memory is intact.  Attention span and concentration are intact.  Expressive speech is intact.  Patient's fund of knowledge is within normal limits for educational level.  SENSATION: Grossly intact to light touch bilateral LEs as determined by testing dermatomes L2-S2 Proprioception and hot/cold testing deferred on this date   MUSCULOSKELETAL: Tremor: None Bulk: Normal Tone: Normal No visible step-off along spinal column  Posture Lumbar lordosis: Reduced with patient assuming kyphotic posture in sitting/standing Lumbar lateral shift: negative Forward head, rounded shoulders  Gait Decreased R stance time, antalgic pattern. Forward flexed posture throughout gait cycle   Palpation Tenderness to palpation along bilateral lumbar paraspinals L4-S1   Strength (out of 5) R/L Quick myotomal screen: 5/4+ Hip flexion 5/4+ Knee extension 5/4+ Ankle dorsiflexion Unable to perform heel or toe walking *Indicates pain   AROM (degrees) R/L (all movements include overpressure unless otherwise stated) Lumbar forward flexion (65): 100% Lumbar extension (30): To neutral* Lumbar lateral flexion (25): R: 100% L: 75% Thoracic and Lumbar rotation (30 degrees):  R: WFL degrees L: WFL degrees Hip IR (0-45): R: WFL L: WFL* Hip ER (0-45): R: WFL L: WFL  Hip Flexion (0-125): WFL bilat, no pain *Indicates pain  Reflexes Patellar tendon: R 1+, L 2+ Achilles tendon: R 1+, L 1+   Repeated Movements Prone lying: no effect, no pain following Prone on elbows (sustained extension in lying): no effect, no pain following (ongoing pain with standing after)  Repeated flexion in lying (double knee to chest): no effect    Muscle Length Hamstrings: R: lacking 40 degrees L: lacking 30 degrees    Passive Accessory Intervertebral Motion (PAIVM) Pt denies  reproduction of back pain with CPA L1-L5 and UPA bilaterally L1-L5. Generally hypomobile throughout mid to lower lumbar spine.     SPECIAL TESTS Lumbar Radiculopathy and Discogenic: Centralization and Peripheralization (SN 92, -LR 0.12): Positive for peripheralization with extension postures and standing Slump (SN 83, -LR 0.32): R: Negative L: Negative SLR (SN 92, -LR 0.29): R: Negative L:  Negative Crossed SLR (SP 90): R: Negative L: Negative  Lumbar Spinal Stenosis: Lumbar quadrant (SN 70): R: Positive L: Positive  Hip: FABER (SN 81): R: Negative L: Negative  Lumbar traction (bilateral LE distraction in hooklying): Improved     TREATMENT  Therapeutic Exercise - for improved soft tissue flexibility and extensibility as needed for ROM, repeated movement in direction of preference to reduce referred pain, HEP establishment  Prone on elbows trial, x 2 min Double knee to chest (repeated flexion in lying); x10 Single knee to chest; x10  Patient education: Patient educated on current condition, anatomy involved, prognosis, plan of care. Discussion on activity modification to prevent flare-up of condition, including shorter bouts of walking/standing until symptoms are improved. HEP review and provision of handout.      ASSESSMENT Clinical Impression: Pt is a pleasant 64 year old male referred for lumbar radiculopathy with R>L-sided referred lower limb symptoms, more L-sided hip pain. Clinical presentation is consistent with lumbar spinal stenosis with irritated/sensitized nerve root following recent lifting incident. Clinical presentation is mixed with L-sided hip pain and LLE strength deficits per quick myotomal screen, though more radiating pain is present in R lower limb. PT examination reveals activity limitations in static standing activity, ambulation, functional lifting, bed mobility. Pt presents with deficits in LLE strength, lumbar spine accessory mobility/PAIVM, thoracolumbar  range of motion, and lower lumber pain pain with referred pain R>LLE. Pt will benefit from skilled PT services to address deficits and return to pain-free function at home and work.     PT Short Term Goals - 12/16/20 1259       PT SHORT TERM GOAL #1   Title Patient will demonstrate normalized gait pattern with symmetrical stance time on bilateral LE without excessive forward flexed posture and no other major deviations and no increase in pain wth gait x 150 feet indicative of improved ability to tolerate short-distance walking    Baseline 12/16/20: Significant gait deviations and pain with weightbearing and upright stance.    Time 3    Period Weeks    Status New    Target Date 01/08/21      PT SHORT TERM GOAL #2   Title Pt will be independent and 100% compliant with established HEP and activity modification as needed to augment PT intervention and prevent flare-up of back pain as needed for best return to prior level of function.    Baseline 12/16/20: baseline flexion bias HEP initiated    Time 2    Period Weeks    Status New    Target Date 12/30/20  PT Long Term Goals - 12/16/20 1257       PT LONG TERM GOAL #1   Title Pt. will demonstrate improved function as evidenced by an increase of FOTO score to 61 or greater for full participation in activities at home and in the community.    Baseline 12/16/20: FOTO 35    Time 6    Period Weeks    Status New    Target Date 01/29/21      PT LONG TERM GOAL #2   Title Pt will decrease worst back pain as reported on NPRS by at least 2 points in order to demonstrate clinically significant reduction in back pain.    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) with  sound body mechanics and no increase in pain > 1-2/10    Baseline 12/16/20: Symptoms exacerbated with lifting    Time 6    Period Weeks    Status New    Target Date 01/29/21      PT LONG TERM GOAL #4   Title Patient will have no disturbed sleep secondary to low back pain as needed for healthy sleep quality/function    Baseline 12/16/20: Disturbed sleep with change in position/trunk rotation in lying    Time 6    Period Weeks    Status New    Target Date 01/29/21                    Plan - 12/16/20 1547     Clinical Impression Statement Pt is a pleasant 65 year old male referred for lumbar radiculopathy with R>L-sided referred lower limb symptoms, more L-sided hip pain. Clinical presentation is consistent with lumbar spinal stenosis with irritated/sensitized nerve root following recent lifting incident. Clinical presentation is mixed with L-sided hip pain and LLE strength deficits per quick myotomal screen, though more radiating pain is present in R lower limb. PT examination reveals activity limitations in static standing activity, ambulation, functional lifting, bed mobility. Pt presents with deficits in LLE strength, lumbar spine accessory mobility/PAIVM, thoracolumbar range of motion, and lower lumber pain pain with referred pain R>LLE. Pt will benefit from skilled PT services to address deficits and return to pain-free function at home and work.    Personal Factors and Comorbidities Age;Comorbidity 3+;Profession    Comorbidities Type 2 DM, high cholesterol, HTN, Kidney disease    Examination-Activity Limitations Stand;Bed Mobility;Lift;Locomotion Level    Examination-Participation Restrictions Occupation;Community Activity    Stability/Clinical Decision Making Evolving/Moderate complexity    Clinical Decision Making Moderate    Rehab Potential Good    PT Frequency 2x / week    PT Duration 6 weeks    PT Treatment/Interventions Cryotherapy;Electrical Stimulation;Moist  Heat;Therapeutic activities;Therapeutic exercise;Neuromuscular re-education;Patient/family education;Manual techniques;Dry needling    PT Next Visit Plan Assess for response to flexion bias program. Utilize lumbar spine distraction for pain control as needed. Manual therapy for sensitivity along lumbar paraspinals. Graded movement as tolerated, flexion bias.    PT Home Exercise Plan Access Code [email or web address]    Consulted and Agree with Plan of Care Patient             Patient will benefit from skilled therapeutic intervention in  order to improve the following deficits and impairments:  Abnormal gait, Decreased mobility, Difficulty walking, Hypomobility, Pain, Decreased range of motion, Decreased strength, Impaired flexibility  Visit Diagnosis: Acute bilateral low back pain with sciatica, sciatica laterality unspecified  Difficulty in walking, not elsewhere classified  Muscle weakness (generalized)     Problem List There are no problems to display for this patient.  Consuela Mimes, PT, DPT #[id number]  Gertie Exon, PT 12/16/2020, 9:55 PM  [facility] [address] [place], [place], [address] Phone: [phone]   Fax:  [phone]  Name: Andrew Acevedo MRN: [id number] Date of Birth: 1955-09-17

## 2020-12-22 ENCOUNTER — Other Ambulatory Visit: Payer: Self-pay

## 2020-12-22 ENCOUNTER — Ambulatory Visit: Payer: BC Managed Care – PPO | Admitting: Physical Therapy

## 2020-12-24 ENCOUNTER — Other Ambulatory Visit: Payer: Self-pay

## 2020-12-24 ENCOUNTER — Ambulatory Visit: Payer: BC Managed Care – PPO | Admitting: Physical Therapy

## 2020-12-24 DIAGNOSIS — M5440 Lumbago with sciatica, unspecified side: Secondary | ICD-10-CM | POA: Diagnosis not present

## 2020-12-24 DIAGNOSIS — R262 Difficulty in walking, not elsewhere classified: Secondary | ICD-10-CM

## 2020-12-24 DIAGNOSIS — M6281 Muscle weakness (generalized): Secondary | ICD-10-CM

## 2020-12-24 NOTE — Therapy (Signed)
[facility] [address] [place], [place], [address] Phone: [phone]   Fax:  [phone]  Physical Therapy Treatment  Patient Details  Name: Andrew Acevedo MRN: [id number] Date of Birth: [birth year] Referring Provider (PT): Altamese Cabal, PA-C   Encounter Date: 12/24/2020   PT End of Session - 12/24/20 1911     Visit Number 2    Number of Visits 13    Date for PT Re-Evaluation 01/29/21    Authorization Type 50 PT visits per calendar year    Authorization Time Period Cert 6/96/29-52/84/13    PT Start Time 1759    PT Stop Time 1850    PT Time Calculation (min) 51 min    Activity Tolerance Patient limited by pain;Patient tolerated treatment well    Behavior During Therapy [facility] for tasks assessed/performed             Past Medical History:  Diagnosis Date   Diabetes (HCC)    High cholesterol    Hypertension    Kidney disease     No past surgical history on file.  There were no vitals filed for this visit.   Subjective Assessment - 12/24/20 1802     Subjective Patient reports 7/10 pain with walking and 0/10 pain at rest. He states he is unable to perform PF in WB position. He does state compliance with HEP stretches and that they "have really helped."    Pertinent History Pt is a 65 year old male with primary complaint of pain radiating down his R>LLE with numbness in his R foot along lateral plantar aspect. Patient reports pain in low back and posterior hip region (more pain in L hip). Patient reports symptoms started with heavy lifting (began about 2 weeks ago) - pain did not start immediately upon lifting. Insidious onset of pain following lifting. He initially went to [facility] and underwent prednisone taper - this did help, but he has ongoing pain and reports feeling more pain on Monday following completion of prednisone taper. He reports some back injuries in high school, but current "shooting pain" is new. (-) X-ray  for fracture/dislocation or acute findings. Pt was informed about some spondylosis and degenerative disc changes. 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 Patient reports disturbed sleep when turning over in bed - this can improve with re-positioning. Patient reports no bowel/bladder changes.    Limitations Walking;Standing;House hold activities;Lifting    Diagnostic tests Lumbar spine radiographs taken outside of [facility] setting; per patient report, evidence of spondylosis and degenerative disc changes    Patient Stated Goals Improved pain, improved walking    Pain Score 7     Pain Location Back    Pain Orientation Right;Left;Lower    Pain Descriptors / Indicators Shooting                 TREATMENT  Manual Therapy -   STM to B paraspinals and gluteals - no trigger points  CPA, L2-S1, grade I-II; 2x30 seconds at each level Prone press ups on elbows (progressing to partial elbow extension) with clinician overpressure (CPA mobilization with 1 sec hold); x10 Long axis lumbar traction in supine  Therapeutic Exercise - for improved soft tissue flexibility and extensibility as needed for ROM, repeated movement in direction of preference to reduce referred pain, HEP establishment   Prone on elbows, x 2 min Double knee  to chest stretch with 30 second hold; x3 Single knee to chest stretch with 30 second hold; x2 each  Piriformis stretch; 2x30 seconds each Hamstring stretch; 2x60 seconds each Posterior pelvic tilt, hooklying; x15 Posterior pelvic tilt, hooklying with alternating march; 2x10 each Physioball rollouts, center, right and left; x6 each direction with brief hold at each endpoint      Assessed pt ability to perform plantar flexion in standing. Unable to achieve heel lift with full weight bearing; increased pain with hip and spinal  extension. Pt was able to achieve heel lift with heavy BUE support (partial WB) and hips/trunk in flexed position.  Assessed standing balance after pt reported difficulty. Increased ankle and hip strategies utilized to maintain Romberg and sharpened Rombeg on blue airex pad. Unable to obtain tandem stance due to sharp, shooting pain through LLE.     MHP (unbilled/indirect time) utilized post-treatment for analgesic effect and improved soft tissue extensibility, x 5 minutes    ASSESSMENT Patient demonstrated excellent motivation throughout PT session. No notable trigger points or tenderness in lumbar paraspinals or gluteals; pt reported no pain upon deep palpation. He performed all mat exercises well with minimal to no increase in pain including initiation of core stabilization exercises. He did report mild pain with anterior pelvic tilt so PT instructed pt to only perform posterior tilt. Patient informed PT he has been unable to perform a heel lift since the lifting accident. Upon assessment, he has full ROM bilaterally and is able to perform heel lift with forward flexed trunk and partial WB with maximal UE support; he was unable to achieve lift without UE support and trunk in semi-upright posture. He continues to demonstrate an antalgic gait pattern with a significantly flexed trunk. Patient's pain did increase and return to baseline 7/10 in standing at end of session.  Pt presents with deficits in LLE strength, lumbar spine accessory mobility/PAIVM, thoracolumbar range of motion, and lower lumber pain pain with referred pain R>LLE. Pt will benefit from skilled PT services to address deficits and return to pain-free function at home and work.               PT Short Term Goals - 12/16/20 1259       PT SHORT TERM GOAL #1   Title Patient will demonstrate normalized gait pattern with symmetrical stance time on bilateral LE without excessive forward flexed posture and no other major deviations  and no increase in pain wth gait x 150 feet indicative of improved ability to tolerate short-distance walking    Baseline 12/16/20: Significant gait deviations and pain with weightbearing and upright stance.    Time 3    Period Weeks    Status New    Target Date 01/08/21      PT SHORT TERM GOAL #2   Title Pt will be independent and 100% compliant with established HEP and activity modification as needed to augment PT intervention and prevent flare-up of back pain as needed for best return to prior level of function.    Baseline 12/16/20: baseline flexion bias HEP initiated    Time 2    Period Weeks    Status New    Target Date 12/30/20               PT Long Term Goals - 12/16/20 1257       PT LONG TERM GOAL #1   Title Pt. will demonstrate improved function as evidenced by an increase of FOTO score to 61 or  greater for full participation in activities at home and in the community.    Baseline 12/16/20: FOTO 35    Time 6    Period Weeks    Status New    Target Date 01/29/21      PT LONG TERM GOAL #2   Title Pt will decrease worst back pain as reported on NPRS by at least 2 points in order to demonstrate clinically significant reduction in back pain.    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) with sound body mechanics and no increase in pain > 1-2/10    Baseline 12/16/20: Symptoms exacerbated with lifting    Time 6    Period Weeks    Status New    Target Date 01/29/21      PT LONG TERM GOAL #4   Title Patient will have no disturbed sleep secondary to low back pain as needed for healthy sleep quality/function    Baseline 12/16/20: Disturbed sleep with change in position/trunk rotation in lying    Time 6    Period  Weeks    Status New    Target Date 01/29/21                   Plan - 12/24/20 1911     Clinical Impression Statement Patient demonstrated excellent motivation throughout PT session. No notable trigger points or tenderness in lumbar paraspinals or gluteals; pt reported no pain upon deep palpation. He performed all mat exercises well with minimal to no increase in pain including initiation of core stabilization exercises. He did report mild pain with anterior pelvic tilt so PT instructed pt to only perform posterior tilt. Patient informed PT he has been unable to perform a heel lift since the lifting accident. Upon assessment, he has full ROM bilaterally and is able to perform heel lift with forward flexed trunk and partial WB with maximal UE support; he was unable to achieve lift without UE support and trunk in semi-upright posture. He continues to demonstrate an antalgic gait pattern with a significantly flexed trunk. Patient's pain did increase and return to baseline 7/10 in standing at end of session.  Pt presents with deficits in LLE strength, lumbar spine accessory mobility/PAIVM, thoracolumbar range of motion, and lower lumber pain pain with referred pain R>LLE. Pt will benefit from skilled PT services to address deficits and return to pain-free function at home and work.    Personal Factors and Comorbidities Age;Comorbidity 3+;Profession    Comorbidities Type 2 DM, high cholesterol, HTN, Kidney disease    Examination-Activity Limitations Stand;Bed Mobility;Lift;Locomotion Level    Examination-Participation Restrictions Occupation;Community Activity    Stability/Clinical Decision Making Evolving/Moderate complexity    Rehab Potential Good    PT Frequency 2x / week    PT Duration 6 weeks    PT Treatment/Interventions Cryotherapy;Electrical Stimulation;Moist Heat;Therapeutic activities;Therapeutic exercise;Neuromuscular re-education;Patient/family education;Manual techniques;Dry needling     PT Next Visit Plan Assess for response to flexion bias program. Utilize lumbar spine distraction for pain control as needed. Manual therapy for sensitivity along lumbar paraspinals. Graded movement as tolerated, flexion bias.    PT Home Exercise Plan Access Code [email or web address]    Consulted and Agree  with Plan of Care Patient             Patient will benefit from skilled therapeutic intervention in order to improve the following deficits and impairments:  Abnormal gait, Decreased mobility, Difficulty walking, Hypomobility, Pain, Decreased range of motion, Decreased strength, Impaired flexibility  Visit Diagnosis: Acute bilateral low back pain with sciatica, sciatica laterality unspecified  Difficulty in walking, not elsewhere classified  Muscle weakness (generalized)     Problem List There are no problems to display for this patient.   Basilia Jumbo PT, DPT  Lavenia Atlas, PT 12/24/2020, 7:14 PM  [facility] [address] [place], [place], [address] Phone: [phone]   Fax:  [phone]  Name: Andrew Acevedo MRN: [id number] Date of Birth: [birth year]

## 2020-12-29 ENCOUNTER — Encounter: Payer: BC Managed Care – PPO | Admitting: Physical Therapy

## 2021-01-07 ENCOUNTER — Encounter: Payer: BC Managed Care – PPO | Admitting: Physical Therapy

## 2021-01-12 ENCOUNTER — Encounter: Payer: BC Managed Care – PPO | Admitting: Physical Therapy

## 2021-01-14 ENCOUNTER — Encounter: Payer: BC Managed Care – PPO | Admitting: Physical Therapy

## 2021-01-19 ENCOUNTER — Encounter: Payer: BC Managed Care – PPO | Admitting: Physical Therapy

## 2021-01-21 ENCOUNTER — Encounter: Payer: BC Managed Care – PPO | Admitting: Physical Therapy

## 2021-07-27 ENCOUNTER — Ambulatory Visit
Admission: EM | Admit: 2021-07-27 | Discharge: 2021-07-27 | Disposition: A | Discharge status: Home / Self Care | Payer: BC Managed Care – PPO

## 2021-07-27 DIAGNOSIS — S61001A Unspecified open wound of right thumb without damage to nail, initial encounter: Secondary | ICD-10-CM | POA: Diagnosis not present

## 2021-07-27 DIAGNOSIS — Z23 Encounter for immunization: Secondary | ICD-10-CM | POA: Diagnosis not present

## 2021-07-27 MED ORDER — TETANUS-DIPHTH-ACELL PERTUSSIS 5-2.5-18.5 LF-MCG/0.5 IM SUSY
0.5000 mL | PREFILLED_SYRINGE | Freq: Once | INTRAMUSCULAR | Status: AC
  Administered 2021-07-27: 0.5 mL via INTRAMUSCULAR

## 2021-07-27 NOTE — ED Triage Notes (Signed)
Pt reports he "took a chunk out of my right thumb" while using a mandolin.  ?

## 2021-07-27 NOTE — Discharge Instructions (Signed)
-  Tetanus has been updated today and we have cleaned your wound. ?- It is too wide to repair with sutures. ?- Keep this clean with soap and water every day at least once a day, more than that if it were to get dirty but you should keep it covered while you are at home.  Can let it air out when you are at home.  Can ice it and take ibuprofen and Tylenol for discomfort if needed. ?- You should return for any signs of infection including increased swelling, redness or pustular drainage. ?

## 2021-07-27 NOTE — ED Provider Notes (Signed)
?[facility] ? ? ? ?CSN: [id number] ?Arrival date & time: 07/27/21  0355 ? ? ?  ? ?History   ?Chief Complaint ?Chief Complaint  ?Patient presents with  ? Extremity Laceration  ? ? ?HPI ?Andrew Acevedo is a 66 y.o. male presenting for skin avulsion of right thumb.  Patient says he was using a mandolin last night and cut it on part of his thumb.  He cleaned it and had it bandaged.  States it occasionally continues to bleed.  He says it also throbs at times.  Denies any associated numbness, weakness or tingling.  Patient unsure of last tetanus.  Medical history significant for diabetes, hypertension, hyperlipidemia and kidney disease.  No other complaints today. ? ?HPI ? ?Past Medical History:  ?Diagnosis Date  ? Diabetes (HCC)   ? High cholesterol   ? Hypertension   ? Kidney disease   ? ? ?There are no problems to display for this patient. ? ? ?History reviewed. No pertinent surgical history. ? ? ? ? ?Home Medications   ? ?Prior to Admission medications   ?Medication Sig Start Date End Date Taking? Authorizing Provider  ?Armodafinil 250 MG tablet armodafinil 250 mg tablet 05/16/20  Yes [provider]  ?azelastine (ASTELIN) 0.1 % nasal spray Place into the nose. 04/01/16  Yes [provider]  ?dapagliflozin propanediol (FARXIGA) 10 MG TABS tablet Farxiga 10 mg tablet 01/12/21  Yes [provider]  ?insulin detemir (LEVEMIR FLEXTOUCH) 100 UNIT/ML FlexPen Levemir FlexPen 100 unit/mL (3 mL) solution subcutaneous insulin pen 06/16/21  Yes [provider]  ?insulin lispro (HUMALOG KWIKPEN) 100 UNIT/ML KwikPen Humalog KwikPen (U-100) Insulin 100 unit/mL subcutaneous 05/08/18  Yes [provider]  ?levocetirizine (XYZAL) 5 MG tablet levocetirizine 5 mg tablet 10/21/17  Yes [provider]  ?metoprolol (TOPROL-XL) 200 MG 24 hr tablet metoprolol succinate ER 200 mg tablet,extended release 24 hr 04/03/21  Yes [provider]  ?pantoprazole (PROTONIX) 40 MG  tablet Take by mouth. 04/25/18  Yes [provider]  ?pramipexole (MIRAPEX) 0.5 MG tablet pramipexole 0.5 mg tablet 05/09/18  Yes [provider]  ?rosuvastatin (CRESTOR) 10 MG tablet rosuvastatin 10 mg tablet 05/13/21  Yes [provider]  ?Semaglutide, 2 MG/DOSE, (OZEMPIC, 2 MG/DOSE,) 8 MG/3ML SOPN Ozempic 2 mg/dose (8 mg/3 mL) subcutaneous pen injector 06/22/21  Yes [provider]  ?spironolactone (ALDACTONE) 25 MG tablet Take by mouth. 07/22/21 07/22/22 Yes [provider]  ?aspirin 81 MG EC tablet Take by mouth.    [provider]  ?felodipine (PLENDIL) 10 MG 24 hr tablet Take 10 mg by mouth daily.    [provider]  ?gabapentin (NEURONTIN) 300 MG capsule gabapentin 300 mg capsule    [provider]  ?hydrALAZINE (APRESOLINE) 100 MG tablet Take 100 mg by mouth 2 (two) times daily.    [provider]  ?lisinopril (ZESTRIL) 40 MG tablet Take 40 mg by mouth daily.    [provider]  ?methocarbamol (ROBAXIN) 500 MG tablet Take 500 mg by mouth 4 (four) times daily.    [provider]  ?pantoprazole (PROTONIX) 40 MG tablet Take 40 mg by mouth daily.    [provider]  ? ? ?Family History ?Family History  ?Problem Relation Age of Onset  ? Diabetes Mother   ? Cancer Father   ? ? ?Social History ?Social History  ? ?Tobacco Use  ? Smoking status: Never  ?  Passive exposure: Never  ? Smokeless tobacco: Never  ?Vaping Use  ?  Vaping Use: Never used  ?Substance Use Topics  ? Alcohol use: Not Currently  ? Drug use: Never  ? ? ? ?Allergies   ?Codeine ? ? ?Review of Systems ?Review of Systems  ?Musculoskeletal:  Negative for arthralgias and joint swelling.  ?Skin:  Positive for wound. Negative for color change.  ?Neurological:  Negative for weakness and numbness.  ? ? ?Physical Exam ?Triage Vital Signs ?ED Triage Vitals  ?Enc Vitals Group  ?   BP 07/27/21 0847 140/78  ?   Pulse Rate 07/27/21 0847 79  ?   Resp 07/27/21 0847 16   ?   Temp 07/27/21 0847 98.2 ?F (36.8 ?C)  ?   Temp Source 07/27/21 0847 Oral  ?   SpO2 07/27/21 0847 98 %  ?   Weight 07/27/21 0841 180 lb (81.6 kg)  ?   Height 07/27/21 0841 5\' 5"  (1.651 m)  ?   Head Circumference --   ?   Peak Flow --   ?   Pain Score 07/27/21 0841 3  ?   Pain Loc --   ?   Pain Edu? --   ?   Excl. in GC? --   ? ?No data found. ? ?Updated Vital Signs ?BP 140/78 (BP Location: Left Arm)   Pulse 79   Temp 98.2 ?F (36.8 ?C) (Oral)   Resp 16   Ht 5\' 5"  (1.651 m)   Wt 180 lb (81.6 kg)   SpO2 98%   BMI 29.95 kg/m?  ? ?   ? ?Physical Exam ?Vitals and nursing note reviewed.  ?Constitutional:   ?   General: He is not in acute distress. ?   Appearance: Normal appearance. He is well-developed. He is not ill-appearing.  ?HENT:  ?   Head: Normocephalic and atraumatic.  ?Eyes:  ?   General: No scleral icterus. ?   Conjunctiva/sclera: Conjunctivae normal.  ?Cardiovascular:  ?   Rate and Rhythm: Normal rate.  ?   Pulses: Normal pulses.  ?Pulmonary:  ?   Effort: Pulmonary effort is normal. No respiratory distress.  ?Musculoskeletal:  ?   Cervical back: Neck supple.  ?Skin: ?   General: Skin is warm and dry.  ?   Capillary Refill: Capillary refill takes less than 2 seconds.  ?   Comments: 1.5 cm skin avulsion distal thumb with no active bleeding or contamination. Mild TTP throughout this area. No swelling, erythema. Good pulses.  ?Neurological:  ?   General: No focal deficit present.  ?   Mental Status: He is alert. Mental status is at baseline.  ?   Motor: No weakness.  ?   Gait: Gait normal.  ?Psychiatric:     ?   Mood and Affect: Mood normal.     ?   Behavior: Behavior normal.     ?   Thought Content: Thought content normal.  ? ? ? ?UC Treatments / Results  ?Labs ?(all labs ordered are listed, but only abnormal results are displayed) ?Labs Reviewed - No data to display ? ?EKG ? ? ?Radiology ?No results found. ? ?Procedures ?Procedures (including critical care time) ? ?Medications Ordered in UC ?Medications   ?Tdap (BOOSTRIX) injection 0.5 mL (0.5 mLs Intramuscular Given 07/27/21 0855)  ? ? ?Initial Impression / Assessment and Plan / UC Course  ?I have reviewed the triage vital signs and the nursing notes. ? ?Pertinent labs & imaging results that were available during my care of the patient were reviewed by me and considered in  my medical decision making (see chart for details). ? ?66 year old male presenting for wound of right thumb that occurred when he used a mandolin yesterday.  Patient unsure of tetanus.  Admits to throbbing pain.  No active bleeding.  Vitals normal and stable.  Patient does have 1.5 cm avulsion of skin tissue of right distal thumb.  Wound too wide to be able to pull together with sutures.  Normal pulses and range of motion.  Area was cleansed by nursing staff and again by me.  Applied bacitracin to wound and covered with bandage.  Reviewed current wound guidelines with patient and advised to follow-up for any signs of infection.  Discussed supportive care with Tylenol for pain relief.  Follow-up as needed. ? ? ?Final Clinical Impressions(s) / UC Diagnoses  ? ?Final diagnoses:  ?Open wound of right thumb, initial encounter  ? ? ? ?Discharge Instructions   ? ?  ?-Tetanus has been updated today and we have cleaned your wound. ?- It is too wide to repair with sutures. ?- Keep this clean with soap and water every day at least once a day, more than that if it were to get dirty but you should keep it covered while you are at home.  Can let it air out when you are at home.  Can ice it and take ibuprofen and Tylenol for discomfort if needed. ?- You should return for any signs of infection including increased swelling, redness or pustular drainage. ? ? ? ? ?ED Prescriptions   ?None ?  ? ?PDMP not reviewed this encounter. ?  ?Shirlee Latchaves, Markia Kyer B, PA-C ?07/27/21 [id number] ? ?

## 2023-10-17 DIAGNOSIS — R809 Proteinuria, unspecified: Secondary | ICD-10-CM | POA: Diagnosis not present

## 2023-10-17 DIAGNOSIS — N1831 Chronic kidney disease, stage 3a: Secondary | ICD-10-CM | POA: Diagnosis not present

## 2023-10-17 DIAGNOSIS — I10 Essential (primary) hypertension: Secondary | ICD-10-CM | POA: Diagnosis not present

## 2023-10-17 DIAGNOSIS — E1122 Type 2 diabetes mellitus with diabetic chronic kidney disease: Secondary | ICD-10-CM | POA: Diagnosis not present

## 2023-10-18 DIAGNOSIS — R946 Abnormal results of thyroid function studies: Secondary | ICD-10-CM | POA: Diagnosis not present

## 2023-10-18 DIAGNOSIS — R5383 Other fatigue: Secondary | ICD-10-CM | POA: Diagnosis not present

## 2023-10-18 DIAGNOSIS — H60502 Unspecified acute noninfective otitis externa, left ear: Secondary | ICD-10-CM | POA: Diagnosis not present

## 2023-10-18 DIAGNOSIS — E1122 Type 2 diabetes mellitus with diabetic chronic kidney disease: Secondary | ICD-10-CM | POA: Diagnosis not present

## 2023-10-18 DIAGNOSIS — Z794 Long term (current) use of insulin: Secondary | ICD-10-CM | POA: Diagnosis not present

## 2023-10-18 DIAGNOSIS — F331 Major depressive disorder, recurrent, moderate: Secondary | ICD-10-CM | POA: Diagnosis not present

## 2023-10-18 DIAGNOSIS — G4733 Obstructive sleep apnea (adult) (pediatric): Secondary | ICD-10-CM | POA: Diagnosis not present

## 2023-10-18 DIAGNOSIS — Z1331 Encounter for screening for depression: Secondary | ICD-10-CM | POA: Diagnosis not present

## 2023-10-18 DIAGNOSIS — I25119 Atherosclerotic heart disease of native coronary artery with unspecified angina pectoris: Secondary | ICD-10-CM | POA: Diagnosis not present

## 2023-10-18 DIAGNOSIS — Z955 Presence of coronary angioplasty implant and graft: Secondary | ICD-10-CM | POA: Diagnosis not present

## 2023-10-18 DIAGNOSIS — Z79899 Other long term (current) drug therapy: Secondary | ICD-10-CM | POA: Diagnosis not present

## 2023-10-18 DIAGNOSIS — K219 Gastro-esophageal reflux disease without esophagitis: Secondary | ICD-10-CM | POA: Diagnosis not present

## 2023-10-18 DIAGNOSIS — F411 Generalized anxiety disorder: Secondary | ICD-10-CM | POA: Diagnosis not present

## 2023-10-18 DIAGNOSIS — Z978 Presence of other specified devices: Secondary | ICD-10-CM | POA: Diagnosis not present

## 2023-10-18 DIAGNOSIS — E785 Hyperlipidemia, unspecified: Secondary | ICD-10-CM | POA: Diagnosis not present

## 2023-10-26 DIAGNOSIS — F411 Generalized anxiety disorder: Secondary | ICD-10-CM | POA: Diagnosis not present

## 2023-10-26 DIAGNOSIS — F331 Major depressive disorder, recurrent, moderate: Secondary | ICD-10-CM | POA: Diagnosis not present

## 2023-10-26 DIAGNOSIS — Z955 Presence of coronary angioplasty implant and graft: Secondary | ICD-10-CM | POA: Diagnosis not present

## 2023-10-28 DIAGNOSIS — F331 Major depressive disorder, recurrent, moderate: Secondary | ICD-10-CM | POA: Diagnosis not present

## 2023-10-28 DIAGNOSIS — F411 Generalized anxiety disorder: Secondary | ICD-10-CM | POA: Diagnosis not present

## 2023-10-28 DIAGNOSIS — Z955 Presence of coronary angioplasty implant and graft: Secondary | ICD-10-CM | POA: Diagnosis not present

## 2023-10-31 DIAGNOSIS — Z955 Presence of coronary angioplasty implant and graft: Secondary | ICD-10-CM | POA: Diagnosis not present

## 2023-10-31 DIAGNOSIS — F411 Generalized anxiety disorder: Secondary | ICD-10-CM | POA: Diagnosis not present

## 2023-10-31 DIAGNOSIS — F331 Major depressive disorder, recurrent, moderate: Secondary | ICD-10-CM | POA: Diagnosis not present

## 2023-11-01 DIAGNOSIS — G2581 Restless legs syndrome: Secondary | ICD-10-CM | POA: Diagnosis not present

## 2023-11-01 DIAGNOSIS — G4719 Other hypersomnia: Secondary | ICD-10-CM | POA: Diagnosis not present

## 2023-11-01 DIAGNOSIS — G4733 Obstructive sleep apnea (adult) (pediatric): Secondary | ICD-10-CM | POA: Diagnosis not present

## 2023-11-01 DIAGNOSIS — Z6830 Body mass index (BMI) 30.0-30.9, adult: Secondary | ICD-10-CM | POA: Diagnosis not present

## 2023-11-01 DIAGNOSIS — E611 Iron deficiency: Secondary | ICD-10-CM | POA: Diagnosis not present

## 2023-11-02 DIAGNOSIS — Z955 Presence of coronary angioplasty implant and graft: Secondary | ICD-10-CM | POA: Diagnosis not present

## 2023-11-02 DIAGNOSIS — F331 Major depressive disorder, recurrent, moderate: Secondary | ICD-10-CM | POA: Diagnosis not present

## 2023-11-02 DIAGNOSIS — F411 Generalized anxiety disorder: Secondary | ICD-10-CM | POA: Diagnosis not present

## 2023-11-04 DIAGNOSIS — F331 Major depressive disorder, recurrent, moderate: Secondary | ICD-10-CM | POA: Diagnosis not present

## 2023-11-04 DIAGNOSIS — F411 Generalized anxiety disorder: Secondary | ICD-10-CM | POA: Diagnosis not present

## 2023-11-04 DIAGNOSIS — Z955 Presence of coronary angioplasty implant and graft: Secondary | ICD-10-CM | POA: Diagnosis not present

## 2023-11-08 DIAGNOSIS — F331 Major depressive disorder, recurrent, moderate: Secondary | ICD-10-CM | POA: Diagnosis not present

## 2023-11-08 DIAGNOSIS — Z955 Presence of coronary angioplasty implant and graft: Secondary | ICD-10-CM | POA: Diagnosis not present

## 2023-11-08 DIAGNOSIS — F411 Generalized anxiety disorder: Secondary | ICD-10-CM | POA: Diagnosis not present

## 2023-11-09 DIAGNOSIS — Z955 Presence of coronary angioplasty implant and graft: Secondary | ICD-10-CM | POA: Diagnosis not present

## 2023-11-09 DIAGNOSIS — F411 Generalized anxiety disorder: Secondary | ICD-10-CM | POA: Diagnosis not present

## 2023-11-09 DIAGNOSIS — F331 Major depressive disorder, recurrent, moderate: Secondary | ICD-10-CM | POA: Diagnosis not present

## 2023-11-10 DIAGNOSIS — Z955 Presence of coronary angioplasty implant and graft: Secondary | ICD-10-CM | POA: Diagnosis not present

## 2023-11-10 DIAGNOSIS — F331 Major depressive disorder, recurrent, moderate: Secondary | ICD-10-CM | POA: Diagnosis not present

## 2023-11-10 DIAGNOSIS — F411 Generalized anxiety disorder: Secondary | ICD-10-CM | POA: Diagnosis not present

## 2023-11-11 DIAGNOSIS — I25119 Atherosclerotic heart disease of native coronary artery with unspecified angina pectoris: Secondary | ICD-10-CM | POA: Diagnosis not present

## 2023-11-11 DIAGNOSIS — E1159 Type 2 diabetes mellitus with other circulatory complications: Secondary | ICD-10-CM | POA: Diagnosis not present

## 2023-11-11 DIAGNOSIS — E785 Hyperlipidemia, unspecified: Secondary | ICD-10-CM | POA: Diagnosis not present

## 2023-11-11 DIAGNOSIS — E1169 Type 2 diabetes mellitus with other specified complication: Secondary | ICD-10-CM | POA: Diagnosis not present

## 2023-11-11 DIAGNOSIS — I152 Hypertension secondary to endocrine disorders: Secondary | ICD-10-CM | POA: Diagnosis not present

## 2023-11-14 DIAGNOSIS — F411 Generalized anxiety disorder: Secondary | ICD-10-CM | POA: Diagnosis not present

## 2023-11-14 DIAGNOSIS — Z955 Presence of coronary angioplasty implant and graft: Secondary | ICD-10-CM | POA: Diagnosis not present

## 2023-11-14 DIAGNOSIS — F331 Major depressive disorder, recurrent, moderate: Secondary | ICD-10-CM | POA: Diagnosis not present

## 2023-11-15 DIAGNOSIS — E611 Iron deficiency: Secondary | ICD-10-CM | POA: Diagnosis not present

## 2023-11-15 DIAGNOSIS — Z79899 Other long term (current) drug therapy: Secondary | ICD-10-CM | POA: Diagnosis not present

## 2023-11-15 DIAGNOSIS — G2581 Restless legs syndrome: Secondary | ICD-10-CM | POA: Diagnosis not present

## 2023-11-15 DIAGNOSIS — Z6830 Body mass index (BMI) 30.0-30.9, adult: Secondary | ICD-10-CM | POA: Diagnosis not present

## 2023-11-15 DIAGNOSIS — R946 Abnormal results of thyroid function studies: Secondary | ICD-10-CM | POA: Diagnosis not present

## 2023-11-15 DIAGNOSIS — E1169 Type 2 diabetes mellitus with other specified complication: Secondary | ICD-10-CM | POA: Diagnosis not present

## 2023-11-15 DIAGNOSIS — E785 Hyperlipidemia, unspecified: Secondary | ICD-10-CM | POA: Diagnosis not present

## 2023-11-16 DIAGNOSIS — F411 Generalized anxiety disorder: Secondary | ICD-10-CM | POA: Diagnosis not present

## 2023-11-16 DIAGNOSIS — F331 Major depressive disorder, recurrent, moderate: Secondary | ICD-10-CM | POA: Diagnosis not present

## 2023-11-16 DIAGNOSIS — Z955 Presence of coronary angioplasty implant and graft: Secondary | ICD-10-CM | POA: Diagnosis not present

## 2023-11-18 DIAGNOSIS — F331 Major depressive disorder, recurrent, moderate: Secondary | ICD-10-CM | POA: Diagnosis not present

## 2023-11-18 DIAGNOSIS — Z955 Presence of coronary angioplasty implant and graft: Secondary | ICD-10-CM | POA: Diagnosis not present

## 2023-11-18 DIAGNOSIS — F411 Generalized anxiety disorder: Secondary | ICD-10-CM | POA: Diagnosis not present

## 2023-11-21 DIAGNOSIS — F411 Generalized anxiety disorder: Secondary | ICD-10-CM | POA: Diagnosis not present

## 2023-11-21 DIAGNOSIS — Z955 Presence of coronary angioplasty implant and graft: Secondary | ICD-10-CM | POA: Diagnosis not present

## 2023-11-21 DIAGNOSIS — F331 Major depressive disorder, recurrent, moderate: Secondary | ICD-10-CM | POA: Diagnosis not present

## 2023-11-23 DIAGNOSIS — Z955 Presence of coronary angioplasty implant and graft: Secondary | ICD-10-CM | POA: Diagnosis not present

## 2023-11-23 DIAGNOSIS — F411 Generalized anxiety disorder: Secondary | ICD-10-CM | POA: Diagnosis not present

## 2023-11-23 DIAGNOSIS — F331 Major depressive disorder, recurrent, moderate: Secondary | ICD-10-CM | POA: Diagnosis not present

## 2023-11-25 DIAGNOSIS — F331 Major depressive disorder, recurrent, moderate: Secondary | ICD-10-CM | POA: Diagnosis not present

## 2023-11-25 DIAGNOSIS — Z955 Presence of coronary angioplasty implant and graft: Secondary | ICD-10-CM | POA: Diagnosis not present

## 2023-11-25 DIAGNOSIS — F411 Generalized anxiety disorder: Secondary | ICD-10-CM | POA: Diagnosis not present

## 2023-11-28 DIAGNOSIS — Z955 Presence of coronary angioplasty implant and graft: Secondary | ICD-10-CM | POA: Diagnosis not present

## 2023-11-28 DIAGNOSIS — F331 Major depressive disorder, recurrent, moderate: Secondary | ICD-10-CM | POA: Diagnosis not present

## 2023-11-28 DIAGNOSIS — F411 Generalized anxiety disorder: Secondary | ICD-10-CM | POA: Diagnosis not present

## 2023-11-30 DIAGNOSIS — Z955 Presence of coronary angioplasty implant and graft: Secondary | ICD-10-CM | POA: Diagnosis not present

## 2023-11-30 DIAGNOSIS — F331 Major depressive disorder, recurrent, moderate: Secondary | ICD-10-CM | POA: Diagnosis not present

## 2023-11-30 DIAGNOSIS — F411 Generalized anxiety disorder: Secondary | ICD-10-CM | POA: Diagnosis not present

## 2023-12-02 DIAGNOSIS — F331 Major depressive disorder, recurrent, moderate: Secondary | ICD-10-CM | POA: Diagnosis not present

## 2023-12-02 DIAGNOSIS — Z955 Presence of coronary angioplasty implant and graft: Secondary | ICD-10-CM | POA: Diagnosis not present

## 2023-12-02 DIAGNOSIS — Z794 Long term (current) use of insulin: Secondary | ICD-10-CM | POA: Diagnosis not present

## 2023-12-02 DIAGNOSIS — M26602 Left temporomandibular joint disorder, unspecified: Secondary | ICD-10-CM | POA: Diagnosis not present

## 2023-12-02 DIAGNOSIS — E119 Type 2 diabetes mellitus without complications: Secondary | ICD-10-CM | POA: Diagnosis not present

## 2023-12-02 DIAGNOSIS — F411 Generalized anxiety disorder: Secondary | ICD-10-CM | POA: Diagnosis not present

## 2023-12-06 DIAGNOSIS — R946 Abnormal results of thyroid function studies: Secondary | ICD-10-CM | POA: Diagnosis not present

## 2023-12-07 DIAGNOSIS — F331 Major depressive disorder, recurrent, moderate: Secondary | ICD-10-CM | POA: Diagnosis not present

## 2023-12-07 DIAGNOSIS — Z955 Presence of coronary angioplasty implant and graft: Secondary | ICD-10-CM | POA: Diagnosis not present

## 2023-12-07 DIAGNOSIS — F411 Generalized anxiety disorder: Secondary | ICD-10-CM | POA: Diagnosis not present

## 2023-12-09 DIAGNOSIS — Z955 Presence of coronary angioplasty implant and graft: Secondary | ICD-10-CM | POA: Diagnosis not present

## 2023-12-09 DIAGNOSIS — F331 Major depressive disorder, recurrent, moderate: Secondary | ICD-10-CM | POA: Diagnosis not present

## 2023-12-09 DIAGNOSIS — F411 Generalized anxiety disorder: Secondary | ICD-10-CM | POA: Diagnosis not present

## 2023-12-15 DIAGNOSIS — F411 Generalized anxiety disorder: Secondary | ICD-10-CM | POA: Diagnosis not present

## 2023-12-15 DIAGNOSIS — F331 Major depressive disorder, recurrent, moderate: Secondary | ICD-10-CM | POA: Diagnosis not present

## 2023-12-15 DIAGNOSIS — Z955 Presence of coronary angioplasty implant and graft: Secondary | ICD-10-CM | POA: Diagnosis not present

## 2023-12-16 DIAGNOSIS — F411 Generalized anxiety disorder: Secondary | ICD-10-CM | POA: Diagnosis not present

## 2023-12-16 DIAGNOSIS — F331 Major depressive disorder, recurrent, moderate: Secondary | ICD-10-CM | POA: Diagnosis not present

## 2023-12-16 DIAGNOSIS — Z955 Presence of coronary angioplasty implant and graft: Secondary | ICD-10-CM | POA: Diagnosis not present

## 2023-12-19 ENCOUNTER — Ambulatory Visit: Payer: Self-pay

## 2023-12-19 DIAGNOSIS — Z955 Presence of coronary angioplasty implant and graft: Secondary | ICD-10-CM | POA: Diagnosis not present

## 2023-12-19 DIAGNOSIS — F331 Major depressive disorder, recurrent, moderate: Secondary | ICD-10-CM | POA: Diagnosis not present

## 2023-12-19 DIAGNOSIS — F411 Generalized anxiety disorder: Secondary | ICD-10-CM | POA: Diagnosis not present

## 2023-12-21 DIAGNOSIS — F331 Major depressive disorder, recurrent, moderate: Secondary | ICD-10-CM | POA: Diagnosis not present

## 2023-12-21 DIAGNOSIS — F411 Generalized anxiety disorder: Secondary | ICD-10-CM | POA: Diagnosis not present

## 2023-12-21 DIAGNOSIS — Z955 Presence of coronary angioplasty implant and graft: Secondary | ICD-10-CM | POA: Diagnosis not present

## 2023-12-23 DIAGNOSIS — Z955 Presence of coronary angioplasty implant and graft: Secondary | ICD-10-CM | POA: Diagnosis not present

## 2023-12-23 DIAGNOSIS — F411 Generalized anxiety disorder: Secondary | ICD-10-CM | POA: Diagnosis not present

## 2023-12-23 DIAGNOSIS — F331 Major depressive disorder, recurrent, moderate: Secondary | ICD-10-CM | POA: Diagnosis not present

## 2023-12-27 DIAGNOSIS — F331 Major depressive disorder, recurrent, moderate: Secondary | ICD-10-CM | POA: Diagnosis not present

## 2023-12-27 DIAGNOSIS — Z955 Presence of coronary angioplasty implant and graft: Secondary | ICD-10-CM | POA: Diagnosis not present

## 2023-12-27 DIAGNOSIS — F411 Generalized anxiety disorder: Secondary | ICD-10-CM | POA: Diagnosis not present

## 2023-12-29 DIAGNOSIS — F411 Generalized anxiety disorder: Secondary | ICD-10-CM | POA: Diagnosis not present

## 2023-12-29 DIAGNOSIS — Z955 Presence of coronary angioplasty implant and graft: Secondary | ICD-10-CM | POA: Diagnosis not present

## 2023-12-29 DIAGNOSIS — F331 Major depressive disorder, recurrent, moderate: Secondary | ICD-10-CM | POA: Diagnosis not present

## 2024-01-02 DIAGNOSIS — F331 Major depressive disorder, recurrent, moderate: Secondary | ICD-10-CM | POA: Diagnosis not present

## 2024-01-02 DIAGNOSIS — F411 Generalized anxiety disorder: Secondary | ICD-10-CM | POA: Diagnosis not present

## 2024-01-02 DIAGNOSIS — Z955 Presence of coronary angioplasty implant and graft: Secondary | ICD-10-CM | POA: Diagnosis not present

## 2024-01-04 DIAGNOSIS — F411 Generalized anxiety disorder: Secondary | ICD-10-CM | POA: Diagnosis not present

## 2024-01-04 DIAGNOSIS — F331 Major depressive disorder, recurrent, moderate: Secondary | ICD-10-CM | POA: Diagnosis not present

## 2024-01-04 DIAGNOSIS — Z955 Presence of coronary angioplasty implant and graft: Secondary | ICD-10-CM | POA: Diagnosis not present

## 2024-01-09 DIAGNOSIS — F331 Major depressive disorder, recurrent, moderate: Secondary | ICD-10-CM | POA: Diagnosis not present

## 2024-01-09 DIAGNOSIS — Z955 Presence of coronary angioplasty implant and graft: Secondary | ICD-10-CM | POA: Diagnosis not present

## 2024-01-09 DIAGNOSIS — F411 Generalized anxiety disorder: Secondary | ICD-10-CM | POA: Diagnosis not present

## 2024-01-11 DIAGNOSIS — Z955 Presence of coronary angioplasty implant and graft: Secondary | ICD-10-CM | POA: Diagnosis not present

## 2024-01-11 DIAGNOSIS — F411 Generalized anxiety disorder: Secondary | ICD-10-CM | POA: Diagnosis not present

## 2024-01-11 DIAGNOSIS — F331 Major depressive disorder, recurrent, moderate: Secondary | ICD-10-CM | POA: Diagnosis not present

## 2024-01-12 DIAGNOSIS — E611 Iron deficiency: Secondary | ICD-10-CM | POA: Diagnosis not present

## 2024-01-16 DIAGNOSIS — F329 Major depressive disorder, single episode, unspecified: Secondary | ICD-10-CM | POA: Diagnosis not present

## 2024-01-16 DIAGNOSIS — R809 Proteinuria, unspecified: Secondary | ICD-10-CM | POA: Diagnosis not present

## 2024-01-16 DIAGNOSIS — E038 Other specified hypothyroidism: Secondary | ICD-10-CM | POA: Diagnosis not present

## 2024-01-16 DIAGNOSIS — G2581 Restless legs syndrome: Secondary | ICD-10-CM | POA: Diagnosis not present

## 2024-01-16 DIAGNOSIS — G4733 Obstructive sleep apnea (adult) (pediatric): Secondary | ICD-10-CM | POA: Diagnosis not present

## 2024-01-16 DIAGNOSIS — I2510 Atherosclerotic heart disease of native coronary artery without angina pectoris: Secondary | ICD-10-CM | POA: Diagnosis not present

## 2024-01-16 DIAGNOSIS — Z794 Long term (current) use of insulin: Secondary | ICD-10-CM | POA: Diagnosis not present

## 2024-01-16 DIAGNOSIS — E1122 Type 2 diabetes mellitus with diabetic chronic kidney disease: Secondary | ICD-10-CM | POA: Diagnosis not present

## 2024-01-16 DIAGNOSIS — E669 Obesity, unspecified: Secondary | ICD-10-CM | POA: Diagnosis not present

## 2024-01-16 DIAGNOSIS — Z978 Presence of other specified devices: Secondary | ICD-10-CM | POA: Diagnosis not present

## 2024-01-16 DIAGNOSIS — E1129 Type 2 diabetes mellitus with other diabetic kidney complication: Secondary | ICD-10-CM | POA: Diagnosis not present

## 2024-01-16 DIAGNOSIS — Z79899 Other long term (current) drug therapy: Secondary | ICD-10-CM | POA: Diagnosis not present

## 2024-02-20 DIAGNOSIS — I10 Essential (primary) hypertension: Secondary | ICD-10-CM | POA: Diagnosis not present

## 2024-02-20 DIAGNOSIS — R809 Proteinuria, unspecified: Secondary | ICD-10-CM | POA: Diagnosis not present

## 2024-02-20 DIAGNOSIS — Z794 Long term (current) use of insulin: Secondary | ICD-10-CM | POA: Diagnosis not present

## 2024-02-20 DIAGNOSIS — N1831 Chronic kidney disease, stage 3a: Secondary | ICD-10-CM | POA: Diagnosis not present

## 2024-02-20 DIAGNOSIS — E1122 Type 2 diabetes mellitus with diabetic chronic kidney disease: Secondary | ICD-10-CM | POA: Diagnosis not present
